# Patient Record
Sex: Female | Born: 1975 | Race: Black or African American | Hispanic: No | Marital: Single
Health system: Southern US, Community
[De-identification: ages and names within clinical notes are randomized; demographics above are authoritative.]

## PROBLEM LIST (undated history)

## (undated) DIAGNOSIS — J449 Chronic obstructive pulmonary disease, unspecified: Secondary | ICD-10-CM

## (undated) DIAGNOSIS — I10 Essential (primary) hypertension: Secondary | ICD-10-CM

## (undated) DIAGNOSIS — J45909 Unspecified asthma, uncomplicated: Secondary | ICD-10-CM

## (undated) HISTORY — PX: KNEE SURGERY: SHX244

## (undated) HISTORY — PX: HIP SURGERY: SHX245

## (undated) HISTORY — DX: Unspecified asthma, uncomplicated: J45.909

## (undated) HISTORY — DX: Essential (primary) hypertension: I10

---

## 2008-04-26 ENCOUNTER — Emergency Department (HOSPITAL_COMMUNITY): Admission: EM | Admit: 2008-04-26 | Discharge: 2008-04-26 | Payer: Self-pay | Admitting: Emergency Medicine

## 2008-06-13 ENCOUNTER — Ambulatory Visit: Payer: Self-pay | Admitting: Diagnostic Radiology

## 2008-06-13 ENCOUNTER — Ambulatory Visit (HOSPITAL_BASED_OUTPATIENT_CLINIC_OR_DEPARTMENT_OTHER): Admission: RE | Admit: 2008-06-13 | Discharge: 2008-06-13 | Payer: Self-pay | Admitting: Family Medicine

## 2008-06-19 ENCOUNTER — Encounter: Admission: RE | Admit: 2008-06-19 | Discharge: 2008-06-19 | Payer: Self-pay | Admitting: Family Medicine

## 2010-05-22 LAB — URINALYSIS, ROUTINE W REFLEX MICROSCOPIC
Nitrite: NEGATIVE
Specific Gravity, Urine: 1.021 (ref 1.005–1.030)
Urobilinogen, UA: 1 mg/dL (ref 0.0–1.0)
pH: 8.5 — ABNORMAL HIGH (ref 5.0–8.0)

## 2010-05-22 LAB — CBC
Platelets: 302 10*3/uL (ref 150–400)
RBC: 4.07 MIL/uL (ref 3.87–5.11)
WBC: 4.5 10*3/uL (ref 4.0–10.5)

## 2010-05-22 LAB — LIPASE, BLOOD: Lipase: 25 U/L (ref 11–59)

## 2010-05-22 LAB — COMPREHENSIVE METABOLIC PANEL
ALT: 16 U/L (ref 0–35)
AST: 22 U/L (ref 0–37)
Albumin: 3.6 g/dL (ref 3.5–5.2)
CO2: 24 mEq/L (ref 19–32)
Chloride: 107 mEq/L (ref 96–112)
Creatinine, Ser: 0.84 mg/dL (ref 0.4–1.2)
GFR calc Af Amer: >60 mL/min (ref >60)
GFR calc non Af Amer: >60 mL/min (ref >60)
Sodium: 138 mEq/L (ref 135–145)
Total Bilirubin: 1 mg/dL (ref 0.3–1.2)

## 2010-05-22 LAB — POCT PREGNANCY, URINE: Preg Test, Ur: NEGATIVE

## 2010-05-22 LAB — DIFFERENTIAL
Eosinophils Absolute: 0.1 10*3/uL (ref 0.0–0.7)
Eosinophils Relative: 3 % (ref 0–5)
Lymphocytes Relative: 31 % (ref 12–46)
Lymphs Abs: 1.4 10*3/uL (ref 0.7–4.0)
Monocytes Absolute: 0.3 10*3/uL (ref 0.1–1.0)

## 2010-07-24 ENCOUNTER — Emergency Department (HOSPITAL_COMMUNITY)
Admission: EM | Admit: 2010-07-24 | Discharge: 2010-07-24 | Disposition: A | Discharge status: Home / Self Care | Payer: Medicaid Other | Attending: Emergency Medicine | Admitting: Emergency Medicine

## 2010-07-24 ENCOUNTER — Emergency Department (HOSPITAL_COMMUNITY): Payer: Medicaid Other

## 2010-07-24 DIAGNOSIS — F17200 Nicotine dependence, unspecified, uncomplicated: Secondary | ICD-10-CM | POA: Insufficient documentation

## 2010-07-24 DIAGNOSIS — J40 Bronchitis, not specified as acute or chronic: Secondary | ICD-10-CM | POA: Insufficient documentation

## 2010-07-24 DIAGNOSIS — N63 Unspecified lump in unspecified breast: Secondary | ICD-10-CM | POA: Insufficient documentation

## 2010-07-24 DIAGNOSIS — R093 Abnormal sputum: Secondary | ICD-10-CM | POA: Insufficient documentation

## 2010-07-24 DIAGNOSIS — R05 Cough: Secondary | ICD-10-CM | POA: Insufficient documentation

## 2010-07-24 DIAGNOSIS — R079 Chest pain, unspecified: Secondary | ICD-10-CM | POA: Insufficient documentation

## 2010-07-24 DIAGNOSIS — R059 Cough, unspecified: Secondary | ICD-10-CM | POA: Insufficient documentation

## 2011-01-15 ENCOUNTER — Other Ambulatory Visit: Payer: Self-pay | Admitting: Internal Medicine

## 2011-01-15 DIAGNOSIS — Z1231 Encounter for screening mammogram for malignant neoplasm of breast: Secondary | ICD-10-CM

## 2011-02-11 ENCOUNTER — Ambulatory Visit
Admission: RE | Admit: 2011-02-11 | Discharge: 2011-02-11 | Disposition: A | Discharge status: Home / Self Care | Payer: Medicaid Other | Source: Ambulatory Visit | Attending: Unknown Physician Specialty | Admitting: Unknown Physician Specialty

## 2011-02-11 DIAGNOSIS — Z1231 Encounter for screening mammogram for malignant neoplasm of breast: Secondary | ICD-10-CM

## 2012-02-23 ENCOUNTER — Other Ambulatory Visit: Payer: Self-pay | Admitting: Unknown Physician Specialty

## 2012-02-23 DIAGNOSIS — Z1231 Encounter for screening mammogram for malignant neoplasm of breast: Secondary | ICD-10-CM

## 2012-03-23 ENCOUNTER — Ambulatory Visit: Payer: Medicaid Other

## 2012-04-11 ENCOUNTER — Ambulatory Visit: Payer: Medicaid Other

## 2014-08-30 DIAGNOSIS — M179 Osteoarthritis of knee, unspecified: Secondary | ICD-10-CM | POA: Insufficient documentation

## 2014-08-30 DIAGNOSIS — M1710 Unilateral primary osteoarthritis, unspecified knee: Secondary | ICD-10-CM | POA: Insufficient documentation

## 2015-05-27 ENCOUNTER — Encounter: Payer: Self-pay | Admitting: Family Medicine

## 2015-05-27 ENCOUNTER — Ambulatory Visit (INDEPENDENT_AMBULATORY_CARE_PROVIDER_SITE_OTHER): Payer: Medicaid Other | Admitting: Family Medicine

## 2015-05-27 VITALS — BP 136/78 | HR 71 | Temp 98.6°F | Ht 66.0 in | Wt 253.0 lb

## 2015-05-27 DIAGNOSIS — Z7189 Other specified counseling: Secondary | ICD-10-CM | POA: Diagnosis not present

## 2015-05-27 DIAGNOSIS — F3181 Bipolar II disorder: Secondary | ICD-10-CM

## 2015-05-27 DIAGNOSIS — M1711 Unilateral primary osteoarthritis, right knee: Secondary | ICD-10-CM

## 2015-05-27 DIAGNOSIS — Z7689 Persons encountering health services in other specified circumstances: Secondary | ICD-10-CM

## 2015-05-27 DIAGNOSIS — J45909 Unspecified asthma, uncomplicated: Secondary | ICD-10-CM

## 2015-05-27 DIAGNOSIS — Z862 Personal history of diseases of the blood and blood-forming organs and certain disorders involving the immune mechanism: Secondary | ICD-10-CM | POA: Insufficient documentation

## 2015-05-27 DIAGNOSIS — Z720 Tobacco use: Secondary | ICD-10-CM | POA: Insufficient documentation

## 2015-05-27 DIAGNOSIS — M129 Arthropathy, unspecified: Secondary | ICD-10-CM | POA: Diagnosis not present

## 2015-05-27 DIAGNOSIS — Z9851 Tubal ligation status: Secondary | ICD-10-CM

## 2015-05-27 DIAGNOSIS — Z8711 Personal history of peptic ulcer disease: Secondary | ICD-10-CM

## 2015-05-27 DIAGNOSIS — F1011 Alcohol abuse, in remission: Secondary | ICD-10-CM

## 2015-05-27 DIAGNOSIS — F1010 Alcohol abuse, uncomplicated: Secondary | ICD-10-CM

## 2015-05-27 DIAGNOSIS — Z789 Other specified health status: Secondary | ICD-10-CM

## 2015-05-27 DIAGNOSIS — Z8719 Personal history of other diseases of the digestive system: Secondary | ICD-10-CM | POA: Diagnosis not present

## 2015-05-27 HISTORY — DX: Personal history of peptic ulcer disease: Z87.11

## 2015-05-27 HISTORY — DX: Personal history of diseases of the blood and blood-forming organs and certain disorders involving the immune mechanism: Z86.2

## 2015-05-27 HISTORY — DX: Unilateral primary osteoarthritis, right knee: M17.11

## 2015-05-27 HISTORY — DX: Bipolar II disorder: F31.81

## 2015-05-27 HISTORY — DX: Unspecified asthma, uncomplicated: J45.909

## 2015-05-27 HISTORY — DX: Tobacco use: Z72.0

## 2015-05-27 HISTORY — DX: Other specified health status: Z78.9

## 2015-05-27 HISTORY — DX: Tubal ligation status: Z98.51

## 2015-05-27 HISTORY — DX: Alcohol abuse, in remission: F10.11

## 2015-05-27 LAB — COMPLETE METABOLIC PANEL WITH GFR
ALBUMIN: 4 g/dL (ref 3.6–5.1)
ALK PHOS: 76 U/L (ref 33–115)
ALT: 11 U/L (ref 6–29)
AST: 11 U/L (ref 10–30)
BUN: 11 mg/dL (ref 7–25)
CALCIUM: 9.6 mg/dL (ref 8.6–10.2)
CHLORIDE: 106 mmol/L (ref 98–110)
CO2: 26 mmol/L (ref 20–31)
Creat: 0.86 mg/dL (ref 0.50–1.10)
GFR, Est Non African American: 84 mL/min (ref >=60)
Glucose, Bld: 81 mg/dL (ref 65–99)
POTASSIUM: 4.2 mmol/L (ref 3.5–5.3)
Sodium: 142 mmol/L (ref 135–146)
Total Bilirubin: 0.5 mg/dL (ref 0.2–1.2)
Total Protein: 6.7 g/dL (ref 6.1–8.1)

## 2015-05-27 LAB — LIPID PANEL
CHOLESTEROL: 182 mg/dL (ref 125–200)
HDL: 36 mg/dL — AB (ref >=46)
LDL CALC: 90 mg/dL (ref <130)
TRIGLYCERIDES: 280 mg/dL — AB (ref <150)
Total CHOL/HDL Ratio: 5.1 Ratio — ABNORMAL HIGH (ref <=5.0)
VLDL: 56 mg/dL — AB (ref <30)

## 2015-05-27 LAB — CBC
HEMATOCRIT: 43.5 % (ref 35.0–45.0)
HEMOGLOBIN: 14.5 g/dL (ref 11.7–15.5)
MCH: 31.9 pg (ref 27.0–33.0)
MCHC: 33.3 g/dL (ref 32.0–36.0)
MCV: 95.8 fL (ref 80.0–100.0)
MPV: 9.1 fL (ref 7.5–12.5)
Platelets: 342 10*3/uL (ref 140–400)
RBC: 4.54 MIL/uL (ref 3.80–5.10)
RDW: 13.2 % (ref 11.0–15.0)
WBC: 6.6 10*3/uL (ref 3.8–10.8)

## 2015-05-27 LAB — TSH: TSH: 1.98 mIU/L

## 2015-05-27 MED ORDER — NALTREXONE HCL 50 MG PO TABS: 50.0000 mg | ORAL_TABLET | Freq: Every day | ORAL | Status: DC

## 2015-05-27 MED ORDER — LINACLOTIDE 290 MCG PO CAPS: 290.0000 ug | ORAL_CAPSULE | Freq: Every day | ORAL | Status: DC

## 2015-05-27 MED ORDER — CLONIDINE HCL 0.2 MG PO TABS: 0.2000 mg | ORAL_TABLET | Freq: Two times a day (BID) | ORAL | Status: DC

## 2015-05-27 MED ORDER — MELOXICAM 15 MG PO TABS: 15.0000 mg | ORAL_TABLET | Freq: Every day | ORAL | Status: DC | PRN

## 2015-05-27 MED ORDER — ALBUTEROL SULFATE HFA 108 (90 BASE) MCG/ACT IN AERS: 2.0000 | INHALATION_SPRAY | Freq: Four times a day (QID) | RESPIRATORY_TRACT | Status: AC | PRN

## 2015-05-27 MED ORDER — HYDROXYZINE PAMOATE 50 MG PO CAPS: 50.0000 mg | ORAL_CAPSULE | Freq: Three times a day (TID) | ORAL | Status: DC | PRN

## 2015-05-27 NOTE — Patient Instructions (Signed)
It was a pleasure seeing you today in our clinic. Today we discussed Establishing care in our clinic and your knee pain. Here is the treatment plan we have discussed and agreed upon together:   - I would like for you to begin icing her knee every night when most convenient. When icing apply the ice to the skin for 15-20 minutes then remove the ice for a period of 30 minutes before reapplying for an additional 15-20 minutes. - The meloxicam I prescribed you is the same dose that you had been previously prescribed by the outside provider. Take this medication sparingly as it can cause some stomach discomfort over time. - Take Tylenol as needed for any breakthrough discomfort involving your knee. You may take up to 3000 mg of this medication every day. - In the future be happy to provide any steroid knee injections  If this is something you would be interested in.

## 2015-05-27 NOTE — Assessment & Plan Note (Signed)
Stable: Patient denies any issues with this condition at this time. She states that her current medication regimen is keeping this quite stable. We discussed the risks of asthma with tobacco use and patient stated her understanding. - Readdress possibility of smoking cessation in the future.

## 2015-05-27 NOTE — Progress Notes (Signed)
HPI  CC: Establishing care with new provider Patient is here to establish care with a new provider in a new clinic. She states that she desired this change because she wanted a "good doctor". Initiallypatient states that she has regular back and knee pain but no other health concerns.after a long discussion patient states that she also has asthma which has persisted since her childhood and bipolar 2 disorder. A significant amount of time was spent discussing her past medical history as well as going through her medication list.  Patient also has a history significant for alcohol abuse as she is currently being treated with naltrexone.  She states that this was started approximately 6-9 months ago.At this time she states that shedrinks alcohol once to twice a week.  Patient is a daily smoker and smokes approximately 0.75 packs daily. She used to smoke  As much as 1.5 packs daily. She started smoking when she was 40 years old.  Patient's knee pain is described as "arthritic".She states that she occasionally experiences some clicking/catching. She denies any significant swelling however she does report warmth. Patient has received intra-articular steroid injections in the past but has not had one for approximately 6 months.She currently takes meloxicam for this pain.she denies any injury to this area but states that she had played volleyball and ran track as a child.  ROS: patient denies any headache, vision changes, hearing changes, dizziness, vertigo, dysphasia,  Diaphoresis, shortness of breath, chest pain, abdominal pain,nausea, vomiting, diarrhea, constipation, dysuria, fevers, weakness, numbness, or paresthesias.  Objective: BP 136/78 mmHg  Pulse 71  Temp(Src) 98.6 F (37 C) (Oral)  Ht 5\' 6"  (1.676 m)  Wt 253 lb (114.76 kg)  BMI 40.85 kg/m2  LMP 05/27/2015 Gen: NAD, obese, alert, cooperative, and pleasant. HEENT: NCAT, EOMI, PERRL CV: RRR, no murmur Resp: CTAB, no wheezes,  non-labored Abd: SNTND, BS present, no guarding or organomegaly Ext: No edema, warm, No knee effusions present, right knee slightly warmer than left.  All 4 ligaments intact bilaterally. Bilateral patellar crepitus +1. Neuro: Alert and oriented, Speech clear, No gross deficits  Assessment and plan:  Bipolar 2 disorder (HCC) Stable: Patient states that she is not having any issues with this at this time.  The medication regimen that she is currently on has been working very well for her. - No medication changes at this time.  Arthritis of knee, right Chronic: Worse Patient reports worsening right-sided knee pain. She endorses some clicking and catching. Denies any weakness.Has had intra-articular steroid injections in the past but has not had one in over 6 months. Patient states that worsening knee pain is the primary limiting factor for her lack of daily exercise. - continue meloxicam as previously prescribed by outside provider. >> would like to discontinue her use of this medication as soon as possible (yet unwilling to start opioids in a patient with her history of substance abuse) - Discussed use of concomitant Tylenol with the meloxicam for additional discomfort control - discussed regular use of icing the affected knee to help reduce swelling and discomfort. - Consider intra-articular steroid injection in the future. - Will monitor  History of stomach ulcers No abdominal pain during the visit today. - We'll monitor closely due to her current use of Meloxicam.  Asthma, chronic Stable: Patient denies any issues with this condition at this time. She states that her current medication regimen is keeping this quite stable. We discussed the risks of asthma with tobacco use and patient stated her understanding. -  Readdress possibility of smoking cessation in the future.    Orders Placed This Encounter  Procedures  . COMPLETE METABOLIC PANEL WITH GFR  . CBC  . Lipid panel  . TSH     Meds ordered this encounter  Medications  . Lurasidone HCl (LATUDA) 60 MG TABS    Sig: Take 60 mg by mouth at bedtime.  Marland Kitchen DISCONTD: cloNIDine (CATAPRES) 0.2 MG tablet    Sig: Take 0.2 mg by mouth 2 (two) times daily.  . hydrochlorothiazide (HYDRODIURIL) 50 MG tablet    Sig: Take 50 mg by mouth daily.  Marland Kitchen DISCONTD: hydrOXYzine (VISTARIL) 50 MG capsule    Sig: Take 50 mg by mouth 3 (three) times daily as needed. As needed for anxiety.  Marland Kitchen omeprazole (PRILOSEC) 20 MG capsule    Sig: Take 20 mg by mouth daily.  Marland Kitchen DISCONTD: meloxicam (MOBIC) 15 MG tablet    Sig: Take 15 mg by mouth daily as needed.    Refill:  0  . cloNIDine (CATAPRES) 0.2 MG tablet    Sig: Take 1 tablet (0.2 mg total) by mouth 2 (two) times daily.    Dispense:  60 tablet    Refill:  3  . meloxicam (MOBIC) 15 MG tablet    Sig: Take 1 tablet (15 mg total) by mouth daily as needed.    Dispense:  30 tablet    Refill:  1  . hydrOXYzine (VISTARIL) 50 MG capsule    Sig: Take 1 capsule (50 mg total) by mouth 3 (three) times daily as needed. As needed for anxiety.    Dispense:  60 capsule    Refill:  2  . linaclotide (LINZESS) 290 MCG CAPS capsule    Sig: Take 1 capsule (290 mcg total) by mouth daily before breakfast.    Dispense:  30 capsule    Refill:  5  . naltrexone (DEPADE) 50 MG tablet    Sig: Take 1 tablet (50 mg total) by mouth daily.    Dispense:  30 tablet    Refill:  5  . albuterol (PROVENTIL HFA;VENTOLIN HFA) 108 (90 Base) MCG/ACT inhaler    Sig: Inhale 2 puffs into the lungs every 6 (six) hours as needed for wheezing or shortness of breath.    Dispense:  1 Inhaler    Refill:  2     Kathee Delton, MD,MS,  PGY2 05/27/2015 5:55 PM

## 2015-05-27 NOTE — Assessment & Plan Note (Signed)
No abdominal pain during the visit today. - We'll monitor closely due to her current use of Meloxicam.

## 2015-05-27 NOTE — Assessment & Plan Note (Signed)
Stable: Patient states that she is not having any issues with this at this time.  The medication regimen that she is currently on has been working very well for her. - No medication changes at this time.

## 2015-05-27 NOTE — Assessment & Plan Note (Addendum)
Chronic: Worse Patient reports worsening right-sided knee pain. She endorses some clicking and catching. Denies any weakness.Has had intra-articular steroid injections in the past but has not had one in over 6 months. Patient states that worsening knee pain is the primary limiting factor for her lack of daily exercise. - continue meloxicam as previously prescribed by outside provider. >> would like to discontinue her use of this medication as soon as possible (yet unwilling to start opioids in a patient with her history of substance abuse) - Discussed use of concomitant Tylenol with the meloxicam for additional discomfort control - discussed regular use of icing the affected knee to help reduce swelling and discomfort. - Consider intra-articular steroid injection in the future. - Will monitor

## 2015-05-30 ENCOUNTER — Encounter: Payer: Self-pay | Admitting: Family Medicine

## 2015-05-30 ENCOUNTER — Other Ambulatory Visit: Payer: Self-pay | Admitting: Family Medicine

## 2015-05-30 MED ORDER — ATORVASTATIN CALCIUM 40 MG PO TABS: 40.0000 mg | ORAL_TABLET | Freq: Every day | ORAL | Status: DC

## 2015-06-18 ENCOUNTER — Ambulatory Visit (INDEPENDENT_AMBULATORY_CARE_PROVIDER_SITE_OTHER): Payer: Medicaid Other | Admitting: Family Medicine

## 2015-06-18 ENCOUNTER — Encounter: Payer: Self-pay | Admitting: Family Medicine

## 2015-06-18 VITALS — BP 126/81 | HR 75 | Temp 98.4°F | Ht 66.0 in | Wt 260.0 lb

## 2015-06-18 DIAGNOSIS — M25552 Pain in left hip: Secondary | ICD-10-CM

## 2015-06-18 NOTE — Patient Instructions (Signed)
It was a pleasure seeing you today in our clinic. Today we discussed your hip pain. Here is the treatment plan we have discussed and agreed upon together:   - At this time I'm concerned that he may have some osteoarthritis of your left hip. - Today I've ordered x-rays of your hip and pelvis. [facility] [address] to have these performed. - Once these x-rays are done you may want to schedule a follow-up appointment with me to go over these results. - For now I would recommend regularly icing this area and taking Tylenol for the pain as directed on the bottle.

## 2015-06-18 NOTE — Progress Notes (Signed)
   HPI  CC: Left hip pain Been going on for the last month. Worse in past 2-3 days. Feels like a toothache. Worse w/ walking. "less bad" w/ laying down. Pain radiates to knee (all along lateral side). Upstairs is also much worse. No weakness. Groin pain is also associated with these symptoms. Feels like she has a knot near groin. Denies any injury. It does not swell or become stiff. Pain is worse first thing in the morning and late at night. She denies any limp. No weakness or numbness. Does not know of any medicine that has made this feel better.  Review of Systems   See HPI for ROS. All other systems reviewed and are negative.  CC, SH/smoking status, and VS noted  Objective: BP 126/81 mmHg  Pulse 75  Temp(Src) 98.4 F (36.9 C) (Oral)  Ht 5\' 6"  (1.676 m)  Wt 260 lb (117.935 kg)  BMI 41.99 kg/m2  SpO2 99%  LMP 05/27/2015 Gen: NAD, alert, cooperative, and pleasant. Hip, left: No evidence of swelling, erythema, or injury. Pain to palpation over the greater trochanter, left SI joint, and left groin (with groin being the most significant and most similar to the pain she experiences daily), Significantly decreased range of motion secondary to pain (active and passive), positive Pearlean BrownieFaber, strength 4/5 (unsure if effort was at 100%), sensation intact throughout. No evidence of pain/swelling/instability of the left knee. Neuro: Alert and oriented, Speech clear, No gross deficits  Assessment and plan:  Hip pain Patient is complaining of left-sided hip pain. She denies any gait abnormality or substantial weakness. On physical exam patient stated that the pain experienced with direct palpation over the groin was most consistent with the pain she is complaining of today. 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 Will obtain hip x-rays at this time. - Encouraged Tylenol/Aleve as directed on the  bottle. - Encouraged regular icing of the affected area. - We'll follow-up hip radiographs. - Patient may need referral for intra-articular steroid injections into the affected hip. If patient fails this therapy she will likely qualify for total hip arthroplasty (but I do not suspect she will need this aggressive treatment for quite some time.)    Orders Placed This Encounter  Procedures  . DG HIP UNILAT WITH PELVIS 2-3 VIEWS LEFT    Please ensure these are WEIGHT BEARING radiographs.    Standing Status: Future     Number of Occurrences:      Standing Expiration Date: 08/17/2016    Order Specific Question:  Reason for Exam (SYMPTOM  OR DIAGNOSIS REQUIRED)    Answer:  groin/hip pain    Order Specific Question:  Is the patient pregnant?    Answer:  No    Order Specific Question:  Preferred imaging location?    Answer:  [facility]    Kathee DeltonIan D Gwendy Boeder, MD,MS,  PGY2 06/19/2015 12:21 AM

## 2015-06-19 DIAGNOSIS — M25559 Pain in unspecified hip: Secondary | ICD-10-CM | POA: Insufficient documentation

## 2015-06-19 HISTORY — DX: Pain in unspecified hip: M25.559

## 2015-06-19 NOTE — Assessment & Plan Note (Signed)
Patient is complaining of left-sided hip pain. She denies any gait abnormality or substantial weakness. On physical exam patient stated that the pain experienced with direct palpation over the groin was most consistent with the pain she is complaining of today. 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 Will obtain hip x-rays at this time. - Encouraged Tylenol/Aleve as directed on the bottle. - Encouraged regular icing of the affected area. - We'll follow-up hip radiographs. - Patient may need referral for intra-articular steroid injections into the affected hip. If patient fails this therapy she will likely qualify for total hip arthroplasty (but I do not suspect she will need this aggressive treatment for quite some time.)

## 2015-06-20 ENCOUNTER — Ambulatory Visit
Admission: RE | Admit: 2015-06-20 | Discharge: 2015-06-20 | Disposition: A | Discharge status: Home / Self Care | Payer: Medicaid Other | Source: Ambulatory Visit | Attending: Family Medicine | Admitting: Family Medicine

## 2015-06-20 DIAGNOSIS — M25552 Pain in left hip: Secondary | ICD-10-CM

## 2015-07-04 ENCOUNTER — Ambulatory Visit: Payer: Medicaid Other | Admitting: Family Medicine

## 2015-07-19 ENCOUNTER — Encounter: Payer: Self-pay | Admitting: Family Medicine

## 2015-07-19 ENCOUNTER — Ambulatory Visit (INDEPENDENT_AMBULATORY_CARE_PROVIDER_SITE_OTHER): Payer: Medicaid Other | Admitting: Family Medicine

## 2015-07-19 VITALS — BP 139/66 | HR 85 | Temp 98.0°F | Ht 66.0 in | Wt 264.2 lb

## 2015-07-19 DIAGNOSIS — M1612 Unilateral primary osteoarthritis, left hip: Secondary | ICD-10-CM

## 2015-07-19 DIAGNOSIS — H40003 Preglaucoma, unspecified, bilateral: Secondary | ICD-10-CM

## 2015-07-19 DIAGNOSIS — H43813 Vitreous degeneration, bilateral: Secondary | ICD-10-CM

## 2015-07-19 DIAGNOSIS — M1990 Unspecified osteoarthritis, unspecified site: Secondary | ICD-10-CM

## 2015-07-19 HISTORY — DX: Preglaucoma, unspecified, bilateral: H40.003

## 2015-07-19 HISTORY — DX: Vitreous degeneration, bilateral: H43.813

## 2015-07-19 HISTORY — DX: Unilateral primary osteoarthritis, left hip: M16.12

## 2015-07-19 MED ORDER — PREDNISONE 50 MG PO TABS: 50.0000 mg | ORAL_TABLET | Freq: Every day | ORAL | Status: DC

## 2015-07-19 NOTE — Patient Instructions (Signed)

## 2015-07-19 NOTE — Progress Notes (Signed)
   HPI  CC: Left hip pain Patient is here with continued complaints of left hip pain. At our last visit I had ordered radiographs to be obtained. These showed bone spurring and joint space narrowing of the left acetabulum. Patient states that her pain persists. She denies the use of any ice or heat but states that she has been using Tylenol and Aleve for her pain. These medications have provided some relief but the pain is still very much present. She is looking for additional help at this time.  ROS: Patient denies any trauma/injury, fever, chills, erythema, ecchymoses, swelling, new reduced range of motion, weakness, numbness, or paresthesias.  Objective: BP 139/66 mmHg  Pulse 85  Temp(Src) 98 F (36.7 C) (Oral)  Ht 5\' 6"  (1.676 m)  Wt 264 lb 3.2 oz (119.84 kg)  BMI 42.66 kg/m2  LMP 06/26/2015 Gen: NAD, alert, cooperative, and pleasant. Hip, left: No evidence of swelling, erythema, or injury. Pain to palpation over the greater trochanter, left SI joint, and left groin (with groin being the most significant and most similar to the pain she experiences daily), Significantly decreased range of motion secondary to pain (active and passive), positive Faber, strength 5/5, sensation intact throughout.  Neuro: Alert and oriented, Speech clear, No gross deficits  Assessment and plan:  Arthritis of left hip Patient continues to have pain in her left hip. Pain is unchanged from previous. She states that she has some relief with NSAIDs. Noncompliant with application of ice or home exercises. - Referral to sports medicine; with hope to assess under ultrasound with possibility of intra-articular hip injection. - Referral to physical therapy; with hope for increased compliance with formal physical therapy since home exercises did not prove to be motivating. - Short burst of oral steroids >> advised to discontinue the use of ibuprofen/Aleve/Mobic while on oral prednisone    Orders Placed This  Encounter  Procedures  . Ambulatory referral to Sports Medicine    Referral Priority:  Routine    Referral Type:  Consultation    Referral Reason:  Specialty Services Required    Requested Specialty:  Sports Medicine    Number of Visits Requested:  1  . Ambulatory referral to Physical Therapy    Referral Priority:  Routine    Referral Type:  Physical Medicine    Referral Reason:  Specialty Services Required    Requested Specialty:  Physical Therapy    Number of Visits Requested:  1    Meds ordered this encounter  Medications  . predniSONE (DELTASONE) 50 MG tablet    Sig: Take 1 tablet (50 mg total) by mouth daily with breakfast.    Dispense:  5 tablet    Refill:  0     Kathee DeltonIan D Gerson Fauth, MD,MS,  PGY2 07/19/2015 6:48 PM

## 2015-07-19 NOTE — Assessment & Plan Note (Addendum)
Patient continues to have pain in her left hip. Pain is unchanged from previous. She states that she has some relief with NSAIDs. Noncompliant with application of ice or home exercises. - Referral to sports medicine; with hope to assess under ultrasound with possibility of intra-articular hip injection. - Referral to physical therapy; with hope for increased compliance with formal physical therapy since home exercises did not prove to be motivating. - Short burst of oral steroids >> advised to discontinue the use of ibuprofen/Aleve/Mobic while on oral prednisone

## 2015-07-26 ENCOUNTER — Encounter: Payer: Self-pay | Admitting: Family Medicine

## 2015-07-26 ENCOUNTER — Ambulatory Visit (INDEPENDENT_AMBULATORY_CARE_PROVIDER_SITE_OTHER): Payer: Medicaid Other | Admitting: Family Medicine

## 2015-07-26 VITALS — BP 132/81 | HR 69 | Ht 66.0 in | Wt 243.0 lb

## 2015-07-26 DIAGNOSIS — M1612 Unilateral primary osteoarthritis, left hip: Secondary | ICD-10-CM

## 2015-07-26 DIAGNOSIS — M1990 Unspecified osteoarthritis, unspecified site: Secondary | ICD-10-CM | POA: Diagnosis present

## 2015-07-26 NOTE — Patient Instructions (Signed)
I'm concerned about you having a labral tear of your hip though your mild arthritis being flared up can cause this as well. We will set you up for an intraarticular cortisone injection. Ibuprofen or aleve as needed for pain. Ok to take tylenol in addition to this. Cane or crutch to help with getting around. Heat or ice (whichever feels better) 15 minutes at a time 3-4 times a day. Call me a week after the injection to let me know how you're doing. We will consider an MRI arthrogram if you're still not improving.

## 2015-07-29 ENCOUNTER — Other Ambulatory Visit: Payer: Self-pay | Admitting: Family Medicine

## 2015-07-29 DIAGNOSIS — M25552 Pain in left hip: Secondary | ICD-10-CM

## 2015-08-05 ENCOUNTER — Ambulatory Visit: Payer: Medicaid Other | Attending: Family Medicine | Admitting: Physical Therapy

## 2015-08-05 DIAGNOSIS — M25552 Pain in left hip: Secondary | ICD-10-CM | POA: Diagnosis present

## 2015-08-05 DIAGNOSIS — M25652 Stiffness of left hip, not elsewhere classified: Secondary | ICD-10-CM | POA: Insufficient documentation

## 2015-08-05 NOTE — Assessment & Plan Note (Signed)
independently reviewed radiographs showing only mild arthritis.  Concern for flare of this arthritis vs labral tear based on her exam.  Discussed options (PT, injection) - she would like to go ahead with injection first based on level of pain.  Cane or crutch to help with ambulation, nsaids and tylenol if needed.  Heat/ice.  Call us a week after injection for update on her status.  MR arthrogram another consideration in future.

## 2015-08-05 NOTE — Progress Notes (Signed)
PCP and consultation requested by: Mickie HillierIan McKeag, MD  Subjective:   HPI: Patient is a 40 y.o. female here for left hip pain.  Patient reports she started to get pain in left hip anteriorly about 2 months ago. She did have a fall 4 months ago but unsure if current pain related to that. Pain goes down leg laterally but originates anterolateral left hip. Pain level 7/10, sharp.  Worse with walking. Worse lying on left side. Tried ibuprofen, tylenol, aleve as well. Limping with walking. No skin changes. Gets numbness in left foot if stands too long only.  No past medical history on file.  Current Outpatient Prescriptions on File Prior to Visit  Medication Sig Dispense Refill  . albuterol (PROVENTIL HFA;VENTOLIN HFA) 108 (90 Base) MCG/ACT inhaler Inhale 2 puffs into the lungs every 6 (six) hours as needed for wheezing or shortness of breath. 1 Inhaler 2  . atorvastatin (LIPITOR) 40 MG tablet Take 1 tablet (40 mg total) by mouth daily. 90 tablet 3  . cloNIDine (CATAPRES) 0.2 MG tablet Take 1 tablet (0.2 mg total) by mouth 2 (two) times daily. 60 tablet 3  . hydrochlorothiazide (HYDRODIURIL) 50 MG tablet Take 50 mg by mouth daily.    . hydrOXYzine (VISTARIL) 50 MG capsule Take 1 capsule (50 mg total) by mouth 3 (three) times daily as needed. As needed for anxiety. 60 capsule 2  . linaclotide (LINZESS) 290 MCG CAPS capsule Take 1 capsule (290 mcg total) by mouth daily before breakfast. 30 capsule 5  . Lurasidone HCl (LATUDA) 60 MG TABS Take 60 mg by mouth at bedtime.    . meloxicam (MOBIC) 15 MG tablet Take 1 tablet (15 mg total) by mouth daily as needed. 30 tablet 1  . naltrexone (DEPADE) 50 MG tablet Take 1 tablet (50 mg total) by mouth daily. 30 tablet 5  . omeprazole (PRILOSEC) 20 MG capsule Take 20 mg by mouth daily.    . predniSONE (DELTASONE) 50 MG tablet Take 1 tablet (50 mg total) by mouth daily with breakfast. 5 tablet 0   No current facility-administered medications on file prior to  visit.    No past surgical history on file.  Allergies  Allergen Reactions  . Penicillins Hives  . Aspirin     Social History   Social History  . Marital Status: Single    Spouse Name: N/A  . Number of Children: N/A  . Years of Education: N/A   Occupational History  . Not on file.   Social History Main Topics  . Smoking status: Current Every Day Smoker -- 0.75 packs/day for 21 years    Types: Cigarettes  . Smokeless tobacco: Not on file  . Alcohol Use: 1.2 oz/week    2 Cans of beer per week  . Drug Use: Not on file  . Sexual Activity: Yes    Birth Control/ Protection: Surgical   Other Topics Concern  . Not on file   Social History Narrative    No family history on file.  BP 132/81 mmHg  Pulse 69  Ht 5\' 6"  (1.676 m)  Wt 243 lb (110.224 kg)  BMI 39.24 kg/m2  LMP 06/26/2015  Review of Systems: See HPI above.    Objective:  Physical Exam:  Gen: NAD, comfortable in exam room  Back/Left hip: No gross deformity, scoliosis. No TTP hip, low back.  No midline or bony TTP. FROM back.  Mild limitation IR and ER of hip with pain. Strength LEs 5/5 all muscle groups.  2+ MSRs in patellar and achilles tendons, equal bilaterally. Negative SLRs. Sensation intact to light touch bilaterally. Mild positive left hip logroll.  Negative right hip logroll Negative fabers and piriformis stretches.    Assessment & Plan:  1. Left hip pain - independently reviewed radiographs showing only mild arthritis.  Concern for flare of this arthritis vs labral tear based on her exam.  Discussed options (PT, injection) - she would like to go ahead with injection first based on level of pain.  Cane or crutch to help with ambulation, nsaids and tylenol if needed.  Heat/ice.  Call us a week after injection for update on her status.  MR arthrogram another consideration in future.

## 2015-08-05 NOTE — Therapy (Signed)
[facility] [place] [address]  [address] [place], [place], [address] Phone: [phone]   Fax:  [phone]  Physical Therapy Evaluation  Patient Details  Name: Sandy Blankenship MRN: [id number] Date of Birth: [birth year] Referring Provider: Pearlean BrownieMarshall Chambliss, MD  Encounter Date: 08/05/2015      PT End of Session - 08/05/15 1247    Visit Number 1   PT Start Time 0930   PT Stop Time 1004   PT Time Calculation (min) 34 min   Activity Tolerance Patient tolerated treatment well   Behavior During Therapy [facility] for tasks assessed/performed      No past medical history on file.  No past surgical history on file.  There were no vitals filed for this visit.       Subjective Assessment - 08/05/15 0933    Subjective Pt is a 40 y/o female who presents to OPPT for L hip OA.  Pt reports onsets of L hip pain a few months ago.  Pt reports no interventions so far but injection scheduled for Thursday.   Limitations Walking;Standing;Sitting   How long can you sit comfortably? 1 hour   How long can you stand comfortably? 10-15 min   How long can you walk comfortably? 20 min   Diagnostic tests MRI: arthritis of L hip; bone spurs   Patient Stated Goals return to walking   Currently in Pain? Yes   Pain Score 8    Pain Location Hip   Pain Orientation Left;Lateral   Pain Descriptors / Indicators Sharp;Aching   Pain Type --  subacute   Pain Onset 1 to 4 weeks ago   Pain Frequency Constant   Aggravating Factors  standing; walking, bending; trying to raise leg   Pain Relieving Factors heat            [facility] PT Assessment - 08/05/15 0937    Assessment   Medical Diagnosis L hip OA   Referring Provider Pearlean BrownieMarshall Chambliss, MD   Onset Date/Surgical Date 05/13/15  approx   Prior Therapy 3 years ago for back   Precautions   Precautions None   Restrictions   Weight Bearing Restrictions No   Balance Screen   Has the patient fallen in  the past 6 months Yes   How many times? 1 - fell down hill wearing heels and carrying multiple itens   Has the patient had a decrease in activity level because of a fear of falling?  Yes   Is the patient reluctant to leave their home because of a fear of falling?  No   Home Environment   Living Environment Private residence   Living Arrangements Children   Type of Home Apartment   Home Access Stairs to enter   Entrance Stairs-Number of Steps 14   Entrance Stairs-Rails Can reach both;Left;Right   Home Layout One level   Prior Function   Level of Independence Independent   Vocation Unemployed   Leisure reading; walking   Cognition   Overall Cognitive Status Within Functional Limits for tasks assessed   AROM   Overall AROM Comments L hip limited all motions but unable to fully assess due to guarding 2/2 pain   Strength   Overall Strength Comments unable to further assess due to inability to get into testing position; suspect weakness in all hip motions   Strength Assessment Site Hip;Knee   Right Hip Flexion 4/5   Left Hip Flexion 3+/5   Left Hip ABduction 3/5  Right/Left Knee Right;Left   Right Knee Flexion 4/5   Right Knee Extension 4/5   Left Knee Flexion 4-/5   Left Knee Extension 4-/5   Flexibility   Soft Tissue Assessment /Muscle Length yes   Hamstrings tightness on L   ITB tightness on L   Piriformis tightness on L   Palpation   Palpation comment tenderness along anterior and lateral hip/groin   Ambulation/Gait   Ambulation Distance (Feet) 100 Feet   Gait Pattern Antalgic;Decreased stance time - left;Decreased step length - right;Step-to pattern                   [facility] Adult PT Treatment/Exercise - 08/05/15 0937    Exercises   Exercises Knee/Hip   Knee/Hip Exercises: Stretches   Passive Hamstring Stretch Left;2 reps;30 seconds   ITB Stretch Left;2 reps;30 seconds   Knee/Hip Exercises: Supine   Straight Leg Raises Left;10 reps   Knee/Hip Exercises:  Sidelying   Hip ABduction Left;10 reps                PT Education - 08/05/15 1246    Education provided Yes   Education Details HEP   Person(s) Educated Patient   Methods Explanation;Demonstration;Handout   Comprehension Verbalized understanding                    Plan - 08/05/15 1247    Clinical Impression Statement Pt is a 40 y/o female who presents to OPPT for moderate complexity evaluation of L hip pain; likely due to OA.  Pt demonstrates pain, decreased strength and ROM affecting gait and ability to return to regular exercises.  Issued HEP today and encouraged multiple daily short walks to maintain mobility; letting pain guide activity.  Pt has limited tolerance to exercises due to pain; and recommended heat and to use her TENS unit to help with pain.  Pt verbalized understanding.  Pt's insurance does not allow for additional visits and she is unable to proceed as self pay so will d/c today.   Rehab Potential Good   PT Frequency One time visit   Consulted and Agree with Plan of Care Patient      Patient will benefit from skilled therapeutic intervention in order to improve the following deficits and impairments:  Abnormal gait, Decreased strength, Difficulty walking, Pain, Decreased mobility, Decreased range of motion  Visit Diagnosis: Pain in left hip - Plan: PT plan of care cert/re-cert  Stiffness of left hip, not elsewhere classified - Plan: PT plan of care cert/re-cert     Problem List Patient Active Problem List   Diagnosis Date Noted  . Arthritis of left hip 07/19/2015  . Glaucoma suspect of both eyes 07/19/2015  . Posterior vitreous detachment of both eyes 07/19/2015  . Hip pain 06/19/2015  . No blood products 05/27/2015  . Tobacco use 05/27/2015  . History of anemia 05/27/2015  . Bipolar 2 disorder (HCC) 05/27/2015  . Arthritis of knee, right 05/27/2015  . Asthma, chronic 05/27/2015  . History of stomach ulcers 05/27/2015  . S/P tubal  ligation 05/27/2015  . History of alcohol abuse 05/27/2015   Clarita CraneStephanie F Hermelinda Diegel, PT, DPT 08/05/2015 12:54 PM  [facility] [place] [address]  [address] [place], [place], [address] Phone: [phone]   Fax:  [phone]  Name: Sandy Blankenship MRN: [id number] Date of Birth: 11/11/1975

## 2015-08-05 NOTE — Patient Instructions (Signed)
Hamstring Step 2    Left foot relaxed, knee straight, other leg bent, foot flat. Raise straight leg further upward to maximal range. Hold _20-30__ seconds. Relax leg completely down. Repeat _2-3__ times.  Copyright  VHI. All rights reserved.   Outer Hip Stretch: Reclined IT Band Stretch (Strap)    Strap around opposite foot, pull across only as far as possible with shoulders on mat. Hold for __20-30__ seconds. Repeat _2-3___ times on left leg.  Copyright  VHI. All rights reserved.   Straight Leg Raise    Bend one leg. Raise other leg _6-8___ inches with knee locked. Exhale and tighten thigh muscles while raising leg.  Repeat __5-10__ times. Do __2-3__ sessions per day.  http://[email or web address].exer.us/270   Copyright  VHI. All rights reserved.   Side Leg Raise (Side-Lying)    Lie on side with support leg bent to 90. Lift top leg, leading with heel. Keep lifted leg straight. Hold 1 count. Lower leg to starting position. Repeat __10__ times on left leg.  Perform 2-3 sessions per day.  Copyright  VHI. All rights reserved.

## 2015-08-08 ENCOUNTER — Ambulatory Visit
Admission: RE | Admit: 2015-08-08 | Discharge: 2015-08-08 | Disposition: A | Discharge status: Home / Self Care | Payer: Medicaid Other | Source: Ambulatory Visit | Attending: Family Medicine | Admitting: Family Medicine

## 2015-08-08 DIAGNOSIS — M25552 Pain in left hip: Secondary | ICD-10-CM

## 2015-08-08 MED ORDER — IOPAMIDOL (ISOVUE-M 200) INJECTION 41%
1.0000 mL | Freq: Once | INTRAMUSCULAR | Status: AC
  Administered 2015-08-08: 1 mL via INTRA_ARTICULAR

## 2015-08-08 MED ORDER — METHYLPREDNISOLONE ACETATE 40 MG/ML INJ SUSP (RADIOLOG
120.0000 mg | Freq: Once | INTRAMUSCULAR | Status: AC
  Administered 2015-08-08: 120 mg via EPIDURAL

## 2015-09-05 ENCOUNTER — Ambulatory Visit: Payer: Medicaid Other | Admitting: Family Medicine

## 2015-09-12 ENCOUNTER — Ambulatory Visit: Payer: Medicaid Other | Admitting: Family Medicine

## 2015-09-19 ENCOUNTER — Ambulatory Visit (INDEPENDENT_AMBULATORY_CARE_PROVIDER_SITE_OTHER): Payer: Medicaid Other | Admitting: Family Medicine

## 2015-09-19 ENCOUNTER — Encounter: Payer: Self-pay | Admitting: Family Medicine

## 2015-09-19 VITALS — BP 139/86 | HR 70 | Temp 98.1°F | Ht 66.0 in | Wt 270.0 lb

## 2015-09-19 DIAGNOSIS — M1990 Unspecified osteoarthritis, unspecified site: Secondary | ICD-10-CM

## 2015-09-19 DIAGNOSIS — G8929 Other chronic pain: Secondary | ICD-10-CM

## 2015-09-19 DIAGNOSIS — M1612 Unilateral primary osteoarthritis, left hip: Secondary | ICD-10-CM

## 2015-09-19 DIAGNOSIS — M25552 Pain in left hip: Secondary | ICD-10-CM

## 2015-09-19 MED ORDER — LURASIDONE HCL 60 MG PO TABS: 60.0000 mg | ORAL_TABLET | Freq: Every day | ORAL | 1 refills | Status: DC

## 2015-09-19 NOTE — Patient Instructions (Signed)
It was a pleasure seeing you today in our clinic. Today we discussed your left hip pain. Here is the treatment plan we have discussed and agreed upon together:   - I've sent a referral to orthopedic surgery. You will be contacted in the next 7-10 days to set up an appointment at their clinic. - Continue using anti-inflammatory medications to help with her pain. It sounds like you're receiving some from the pain clinic, continue their use and you may want to discuss with their providers what medications are safe to take with their prescribed medications.

## 2015-09-19 NOTE — Progress Notes (Signed)
   HPI  CC: Left hip pain Patient is here with complaints of left hip pain. She has been seen previously for this issue and had been sent for a fluoroscopic hip injection. She states that this injection provided benefit for approximately 5 days and then wore off seen after. She continues to have the same pain at this time. Pain is worse with going up and down stairs. Pain is located to the left groin and does not radiate. She has relief with offloading of this joint. X-rays were obtained and showed some mild degenerative changes with small osteophyte development. Patient denies any clicking catching or locking of this joint. She denies any injury. She denies any numbness, weakness, or paresthesias. No fevers, chills, swelling, erythema, or warmth.  Review of Systems   See HPI for ROS. All other systems reviewed and are negative.  CC, SH/smoking status, and VS noted  Objective: BP 139/86   Pulse 70   Temp 98.1 F (36.7 C) (Oral)   Ht 5\' 6"  (1.676 m)   Wt 270 lb (122.5 kg)   BMI 43.58 kg/m  Gen: NAD, alert, cooperative, and pleasant. Hip, left: No evidence of swelling, erythema, or injury. Mild discomfort over the greater trochanter and left SI joint. Significant left groin with hip flexion and ER. Significantly decreased range of motion secondary to this pain (active and passive), positive Faber, strength 5/5, sensation intact throughout.  Neuro: Alert and oriented, Speech clear, No gross deficits  Assessment and plan:  Arthritis of left hip Patient is here for chronic left hip pain. Pain is localized to the groin and is worse with forward flexion and abduction/external rotation of the hip. Etiology likely secondary to arthritic/inflammatory changes. Patient received fluoroscopic hip injection on 08/08/15 which provided approximately 5 days relief. X-rays on 06/20/15 showed mild arthritic changes to the left hip. - Patient asking for referral to orthopedic surgery. Referral sent. -  Encouraged anti-inflammatories. Patient is currently receiving these medications from pain clinic.   Orders Placed This Encounter  Procedures  . Ambulatory referral to Orthopedic Surgery    Referral Priority:   Routine    Referral Type:   Surgical    Referral Reason:   Specialty Services Required    Requested Specialty:   Orthopedic Surgery    Number of Visits Requested:   1    Meds ordered this encounter  Medications  . Lurasidone HCl (LATUDA) 60 MG TABS    Sig: Take 1 tablet (60 mg total) by mouth at bedtime.    Dispense:  30 tablet    Refill:  1     Kathee DeltonIan D McKeag, MD,MS,  PGY3 09/19/2015 2:42 PM

## 2015-09-19 NOTE — Assessment & Plan Note (Signed)
Patient is here for chronic left hip pain. Pain is localized to the groin and is worse with forward flexion and abduction/external rotation of the hip. Etiology likely secondary to arthritic/inflammatory changes. Patient received fluoroscopic hip injection on 08/08/15 which provided approximately 5 days relief. X-rays on 06/20/15 showed mild arthritic changes to the left hip. - Patient asking for referral to orthopedic surgery. Referral sent. - Encouraged anti-inflammatories. Patient is currently receiving these medications from pain clinic.

## 2016-05-07 ENCOUNTER — Telehealth: Payer: Self-pay | Admitting: *Deleted

## 2016-05-07 NOTE — Telephone Encounter (Signed)
Patient states she is having hip and back pain.  Requesting refill of Norco.  Will route request to Dr. Wende MottMcKeag.  Altamese Dilling~Jeweliana Dudgeon, BSN, RN-BC

## 2016-05-12 NOTE — Telephone Encounter (Signed)
No. Patient will need an appointment for evaluation. Even with an appointment, it is unlikely that Norco would be appropriate for atraumatic hip/back pain.

## 2016-05-13 NOTE — Telephone Encounter (Signed)
Pt informed and scheduled to see you on Tuesday. Deseree Bruna Potter, CMA

## 2016-05-18 ENCOUNTER — Telehealth: Payer: Self-pay | Admitting: Family Medicine

## 2016-05-18 NOTE — Telephone Encounter (Signed)
No answer, LMOVM. Called to confirm appointment and advise patient to arrive early for check-in/bring in all current medications.

## 2016-05-19 ENCOUNTER — Ambulatory Visit: Payer: Medicaid Other | Admitting: Family Medicine

## 2016-05-25 NOTE — Telephone Encounter (Signed)
Called to confirm appointment for 05/26/16. Patient was advised to arrive early for check-in and to bring any current medications. No further concerns at this time. °

## 2016-05-26 ENCOUNTER — Encounter: Payer: Self-pay | Admitting: Family Medicine

## 2016-05-26 ENCOUNTER — Ambulatory Visit (INDEPENDENT_AMBULATORY_CARE_PROVIDER_SITE_OTHER): Payer: Self-pay | Admitting: Family Medicine

## 2016-05-26 DIAGNOSIS — K529 Noninfective gastroenteritis and colitis, unspecified: Secondary | ICD-10-CM | POA: Insufficient documentation

## 2016-05-26 DIAGNOSIS — M25552 Pain in left hip: Secondary | ICD-10-CM

## 2016-05-26 HISTORY — DX: Noninfective gastroenteritis and colitis, unspecified: K52.9

## 2016-05-26 MED ORDER — ONDANSETRON 4 MG PO TBDP: 4.0000 mg | ORAL_TABLET | Freq: Three times a day (TID) | ORAL | 0 refills | Status: DC | PRN

## 2016-05-26 MED ORDER — NAPROXEN 500 MG PO TABS: 500.0000 mg | ORAL_TABLET | Freq: Two times a day (BID) | ORAL | 2 refills | Status: DC

## 2016-05-26 NOTE — Assessment & Plan Note (Signed)
Patient is here with signs and symptoms most consistent with viral gastroenteritis. Patient is able to drink water but does not have much of an appetite. No red flag symptoms at this time. Patient appears stable and vital signs are within normal range. Afebrile - Push fluids - Reassurance - Zofran ODT 3 times a day when necessary - Return precautions discussed

## 2016-05-26 NOTE — Patient Instructions (Addendum)
It was a pleasure seeing you today in our clinic. Today we discussed your nausea/vomiting and hip pain. Here is the treatment plan we have discussed and agreed upon together:   Viral gastroenteritis: - Stay well-hydrated with water. - I prescribed to Zofran disintegrating tablets. Place 1 under the tongue to disintegrate when experiencing significant nausea and/or vomiting. This should help control your symptoms. - Begin to integrate bland foods into her diet, these tend to be easier on the stomach. But as long as you are able to keep down water it is okay if your appetite does not develop until later this week.  Chronic hip pain: - I placed an order for naproxen. Take 1 tablet twice a day for the next 5 days. Then take 1 tablet twice a day  AS NEEDED for pain after that. - Take over-the-counter Tylenol to help with pain as well. You may take -  3 times a day. Do not exceed this amount of dosing in a 24 hour period.

## 2016-05-26 NOTE — Assessment & Plan Note (Signed)
Chronic/stable: Patient is to have persistent left-sided hip pain consistent with osteoarthritis at the acetabular joint. No red flag symptoms. Patient does not seem to be taking much medication (OTC) for this pain at this time. Patient was previously seen at pain clinic for this issue but has not been able to go due to loss of insurance. She was also seen by orthopedic surgery and unable to follow-up due to the same issue. - Naproxen 500 mg twice a day 5 days. Then twice a day when necessary after that. - OTC Tylenol [phone] mg 3 times a day when necessary. - Discussed looking into [place] card coverage

## 2016-05-26 NOTE — Progress Notes (Signed)
HPI  CC: Hip pain (chronic, left) and nausea/vomiting (acute) Patient is here to discuss her left-sided hip pain. She states the pain is located along the anterior lateral aspect of her left hip. The majority of her pain presents along the groin and does not radiate. Pain is described as achy. No radiation down the leg. Denies weakness or paresthesias. Endorses some bilateral foot numbness when she is standing for prolonged period of time. She denies any recent trauma or injury. She states that this pain has been present ever since she experienced a fall 1-2 years ago. Patient was previously seen by orthopedic surgery. They recommended surgery however she does not have insurance coverage at this time. Patient is also seen at the pain clinic for this pain but has not been able to go due to a loss in coverage from [facility]. Symptoms are relatively unchanged and typical with her chronic pain. Patient states that she takes an occasional Tylenol for this pain.Patient state Denies bowel/bladder incontinence.  Nausea/vomiting. Patient endorses 4 days of nausea and vomiting. She states that her last episode of emesis was last night. Emesis was always gastric contents without evidence of bilious or bloody contents. Denies coffee-ground appearance. Today she says that she "feels crappy". She has been able to drink but does not have an appetite. Endorses diarrhea over the past 4 days as well. No hematochezia or melena. She has not taken anything for any of her symptoms at this time. Denies fever or chills. No body aches  Review of Systems See HPI for ROS.   CC, SH/smoking status, and VS noted  Objective: BP 122/84   Pulse 77   Temp 98 F (36.7 C) (Oral)   Ht  (1.676 m)   Wt 259 lb 9.6 oz (117.8 kg)   LMP 04/25/2016 (Exact Date)   SpO2 98%   BMI 41.90 kg/m  Gen: NAD, alert, cooperative. HEENT: NCAT, EOMI, PERRL, OP clear, no LAD CV: RRR, no murmur Resp: CTAB, no wheezes, non-labored Abd:  SNTND, BS present, no guarding or organomegaly Ext: No edema, warm. ROM limited in forward flexion, abduction, and adduction of the left hip. Pain elicited with ROM and directed towards the groin. Strength 5/5. Sensation intact throughout. Neuro: Alert and oriented, Speech clear, No gross deficits   Assessment and plan:  Hip pain Chronic/stable: Patient is to have persistent left-sided hip pain consistent with osteoarthritis at the acetabular joint. No red flag symptoms. Patient does not seem to be taking much medication (OTC) for this pain at this time. Patient was previously seen at pain clinic for this issue but has not been able to go due to loss of insurance. She was also seen by orthopedic surgery and unable to follow-up due to the same issue. - Naproxen 500 mg twice a day 5 days. Then twice a day when necessary after that. - OTC Tylenol [phone] mg 3 times a day when necessary. - Discussed looking into [employer] card coverage  Gastroenteritis Patient is here with signs and symptoms most consistent with viral gastroenteritis. Patient is able to drink water but does not have much of an appetite. No red flag symptoms at this time. Patient appears stable and vital signs are within normal range. Afebrile - Push fluids - Reassurance - Zofran ODT 3 times a day when necessary - Return precautions discussed   Meds ordered this encounter  Medications  . naproxen (NAPROSYN) 500 MG tablet    Sig: Take 1 tablet (500 mg total) by mouth  2 (two) times daily with a meal. For 5 days. Then take 1 tablet 2 times a day AS NEEDED after that.    Dispense:  60 tablet    Refill:  2  . ondansetron (ZOFRAN-ODT) 4 MG disintegrating tablet    Sig: Take 1 tablet (4 mg total) by mouth every 8 (eight) hours as needed for nausea or vomiting.    Dispense:  30 tablet    Refill:  0     Kathee Delton, MD,MS,  PGY3 05/26/2016 1:45 PM

## 2016-06-03 MED ORDER — LINACLOTIDE 290 MCG PO CAPS: 290.0000 ug | ORAL_CAPSULE | Freq: Every day | ORAL | 0 refills | Status: DC

## 2016-06-03 MED ORDER — HYDROCHLOROTHIAZIDE 50 MG PO TABS: 25.0000 mg | ORAL_TABLET | Freq: Every day | ORAL | 0 refills | Status: DC

## 2016-06-03 NOTE — Telephone Encounter (Signed)
Unsure what BP medications pt is taking as she was last prescribed clonidine over a year ago.  Clonidine should not be taken inconsistently as it appears she's been taking it.  Its reported she has hydrochlorothiazide on her list. Will start this medication at  (half of what was reported). Pt needs f/u with her PCP within the next year for her BP.  Joanna Puff, MD [facility] Resident  06/03/2016, 2:13 PM

## 2016-06-03 NOTE — Telephone Encounter (Signed)
Pt calling to get refill on blood pressure meds and linzess. Call [phone] when ready to be picked up.  Sunday Spillers, CMA

## 2016-06-04 NOTE — Telephone Encounter (Signed)
Patient informed, expressed understanding. 

## 2016-06-05 ENCOUNTER — Other Ambulatory Visit: Payer: Self-pay | Admitting: Family Medicine

## 2016-06-05 MED ORDER — CLONIDINE HCL 0.2 MG PO TABS: 0.2000 mg | ORAL_TABLET | Freq: Two times a day (BID) | ORAL | 0 refills | Status: DC

## 2016-06-05 NOTE — Telephone Encounter (Signed)
Pt  calling to request refill [id number]  Name of Medication(s):clonidine  Last date of OV:  4-17-8 Pharmacy:  [facility] on [address]  Will route refill request to Clinic RN.  Discussed with patient policy to call pharmacy for future refills.  Also, discussed refills may take up to 48 hours to approve or deny.  Avanell Shackleton

## 2016-06-09 ENCOUNTER — Other Ambulatory Visit: Payer: Self-pay | Admitting: Family Medicine

## 2016-06-09 MED ORDER — SERTRALINE HCL 50 MG PO TABS: 50.0000 mg | ORAL_TABLET | Freq: Every day | ORAL | 3 refills | Status: AC

## 2016-07-24 ENCOUNTER — Other Ambulatory Visit: Payer: Self-pay | Admitting: Family Medicine

## 2016-07-24 MED ORDER — LINACLOTIDE 290 MCG PO CAPS: 290.0000 ug | ORAL_CAPSULE | Freq: Every day | ORAL | 0 refills | Status: AC

## 2016-07-24 NOTE — Telephone Encounter (Signed)
Pt is calling and would like a refill on her Linzess called in. jw

## 2016-08-31 ENCOUNTER — Other Ambulatory Visit: Payer: Self-pay | Admitting: Family Medicine

## 2016-08-31 NOTE — Telephone Encounter (Signed)
Pt calling to request refill of:  Name of Medication(s):  clondidine  Last date of OV:  05-26-16 [facility] [address]   Will route refill request to Clinic RN.  Discussed with patient policy to call pharmacy for future refills.  Also, discussed refills may take up to 48 hours to approve or deny.  Markus JarvisEmily C Pittman

## 2016-09-01 MED ORDER — CLONIDINE HCL 0.2 MG PO TABS: 0.2000 mg | ORAL_TABLET | Freq: Two times a day (BID) | ORAL | 0 refills | Status: DC

## 2017-02-23 ENCOUNTER — Other Ambulatory Visit: Payer: Self-pay

## 2017-02-23 ENCOUNTER — Encounter: Payer: Self-pay | Admitting: Family Medicine

## 2017-02-23 ENCOUNTER — Ambulatory Visit (INDEPENDENT_AMBULATORY_CARE_PROVIDER_SITE_OTHER): Payer: Self-pay | Admitting: Family Medicine

## 2017-02-23 VITALS — BP 142/88 | HR 83 | Temp 98.4°F | Ht 66.0 in | Wt 249.4 lb

## 2017-02-23 DIAGNOSIS — E782 Mixed hyperlipidemia: Secondary | ICD-10-CM

## 2017-02-23 DIAGNOSIS — Z720 Tobacco use: Secondary | ICD-10-CM

## 2017-02-23 DIAGNOSIS — G43709 Chronic migraine without aura, not intractable, without status migrainosus: Secondary | ICD-10-CM | POA: Insufficient documentation

## 2017-02-23 DIAGNOSIS — I10 Essential (primary) hypertension: Secondary | ICD-10-CM

## 2017-02-23 DIAGNOSIS — G43719 Chronic migraine without aura, intractable, without status migrainosus: Secondary | ICD-10-CM

## 2017-02-23 DIAGNOSIS — K219 Gastro-esophageal reflux disease without esophagitis: Secondary | ICD-10-CM

## 2017-02-23 HISTORY — DX: Essential (primary) hypertension: I10

## 2017-02-23 HISTORY — DX: Gastro-esophageal reflux disease without esophagitis: K21.9

## 2017-02-23 HISTORY — DX: Mixed hyperlipidemia: E78.2

## 2017-02-23 HISTORY — DX: Chronic migraine without aura, not intractable, without status migrainosus: G43.709

## 2017-02-23 HISTORY — DX: Chronic migraine without aura, intractable, without status migrainosus: G43.719

## 2017-02-23 MED ORDER — ATORVASTATIN CALCIUM 40 MG PO TABS: 40.0000 mg | ORAL_TABLET | Freq: Every day | ORAL | 3 refills | Status: DC

## 2017-02-23 MED ORDER — HYDROCHLOROTHIAZIDE 50 MG PO TABS: 25.0000 mg | ORAL_TABLET | Freq: Every day | ORAL | 0 refills | Status: DC

## 2017-02-23 MED ORDER — SUMATRIPTAN SUCCINATE 50 MG PO TABS: 50.0000 mg | ORAL_TABLET | ORAL | 0 refills | Status: AC | PRN

## 2017-02-23 MED ORDER — CLONIDINE HCL 0.2 MG PO TABS: 0.2000 mg | ORAL_TABLET | Freq: Two times a day (BID) | ORAL | 0 refills | Status: DC

## 2017-02-23 MED ORDER — TOPIRAMATE 25 MG PO TABS: ORAL_TABLET | ORAL | 3 refills | Status: AC

## 2017-02-23 NOTE — Assessment & Plan Note (Addendum)
Chronic. Controlled. Not taking PPI. Will trial off PPI. - Instructed patient to take H2 blocker if symptoms return

## 2017-02-23 NOTE — Patient Instructions (Signed)
Thank you for coming in to see us today. Please see below to review our plan for today's visit.  1.  Headaches are consistent with migraines.  I have prescribed you to new medications to help with this.  Take the Topamax 25 mg tablet once daily for 1 week and increase to 50 mg daily to help prevent migraines.  If a migraine occurs, try and take a sumatriptan 50 mg tablet and every 2 hours as needed until the migraine improves.  I would like to see you in 1 month to make sure this medication is helping. 2.  I sent in your antihypertensive medications.  Please pick these up and take as prescribed.  Please call the clinic at [phone] if your symptoms worsen or you have any concerns. It was our pleasure to serve you.  Durward Parcelavid McMullen, DO [facility], PGY-2

## 2017-02-23 NOTE — Assessment & Plan Note (Addendum)
Chronic. Uncontrolled. Not taking meds for 2 days.  - Refill for HCTZ 25 mg daily and clonidine 0.2 mg twice daily - Refill Lipitor 40 mg daily

## 2017-02-23 NOTE — Progress Notes (Signed)
Subjective   Patient ID: Sandy Blankenship    DOB: August 10, 1975, 42 y.o. female   MRN: [id number]  CC: "Migraines"  HPI: Sandy Blankenship is a 42 y.o. female who presents to clinic today for the following:  Migraines: Patient has been having headaches for the past 3 years.  She is concerned that these are migraines.  Pain is throbbing behind eyes bilaterally and radiates to occipital region.  Attacks occur twice per week.  She has tried Tylenol and NSAIDs without relief.  She is not tried any abortive therapies or prophylactic migraine therapies.  She denies any auras.  Symptoms improve when she lays down in her bedroom in the dark.  She tends to have photophobia during the attacks.  She is not actively having a migraine attack at present.  Hypertension: Patient has been without antihypertensives for 2 days.  She needs refills on all of her medications.  She is an everyday smoker.  GERD: Patient has been on PPI for over one year.  She has been off medications for a week without reflux.  Patient last seen by Dr. Suezanne JacquetMcCaig on 4/17 concerning pain on left hip and instructed to take naproxen for 5 days.  Discussed looking at orange card coverage.  Was seen 2 months later for suicidal ideation with drug screen consistent with cocaine and cannabis as well as alcohol dependence.  Detox successful.  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  ROS: see HPI for pertinent.  PMFSH: Bipolar disorder type II, asthma, polysubstance abuse disorder (cocaine, THC), tobacco use disorder.  Surgical history unremarkable.  Family history unremarkable.  Smoking status reviewed. Medications reviewed.  Objective   BP (!) 142/88   Pulse 83   Temp 98.4 F (36.9 C) (Oral)   Ht 5\' 6"  (1.676 m)   Wt 249 lb 6.4 oz (113.1 kg)   SpO2 99%   BMI 40.25 kg/m  Vitals and nursing note reviewed.  General: well nourished, well developed, NAD with non-toxic appearance HEENT: normocephalic, atraumatic, moist mucous membranes, PERRLA,  EOMI Cardiovascular: regular rate and rhythm without murmurs, rubs, or gallops Lungs: clear to auscultation bilaterally with normal work of breathing Abdomen: soft, non-tender, non-distended, normoactive bowel sounds Skin: warm, dry, no rashes or lesions, cap refill < 2 seconds Extremities: warm and well perfused, normal tone, no edema Neuro: CNII-XII intact, non-tremulous, without dyarthria  Assessment & Plan   Primary hypertension Chronic. Uncontrolled. Not taking meds for 2 days.  - Refill for HCTZ 25 mg daily and clonidine 0.2 mg twice daily - Refill Lipitor 40 mg daily  Intractable chronic migraine without aura and without status migrainosus Chronic. Uncontrolled. Bilateral though suspicious for migrainous headache. Affects patient weekly. Failed Tylenol and NSAIDs. Neuro exam unremarkable. - Trial Imitrex 50 mg PRN and Topomax 25 mg daily with intent to increase to 50 mg daily after 1 week - RTC 1 month for reassessment of control  GERD (gastroesophageal reflux disease) Chronic. Controlled. Not taking PPI. Will trial off PPI. - Instructed patient to take H2 blocker if symptoms return  No orders of the defined types were placed in this encounter.  Meds ordered this encounter  Medications  . SUMAtriptan (IMITREX) 50 MG tablet    Sig: Take 1 tablet (50 mg total) by mouth every 2 (two) hours as needed for migraine. May repeat in 2 hours if headache persists or recurs.    Dispense:  30 tablet    Refill:  0  . topiramate (TOPAMAX) 25 MG tablet    Sig: Take  1 tablet (25 mg) in the evening, then increase to 2 tablets (50 mg) in the evening after 1 week.    Dispense:  180 tablet    Refill:  3  . atorvastatin (LIPITOR) 40 MG tablet    Sig: Take 1 tablet (40 mg total) by mouth daily.    Dispense:  90 tablet    Refill:  3  . cloNIDine (CATAPRES) 0.2 MG tablet    Sig: Take 1 tablet (0.2 mg total) by mouth 2 (two) times daily.    Dispense:  60 tablet    Refill:  0  .  hydrochlorothiazide (HYDRODIURIL) 50 MG tablet    Sig: Take 0.5 tablets (25 mg total) by mouth daily.    Dispense:  30 tablet    Refill:  0    Durward Parcel, DO [facility], PGY-2 02/23/2017, 8:07 PM

## 2017-02-23 NOTE — Assessment & Plan Note (Deleted)
A 

## 2017-02-23 NOTE — Assessment & Plan Note (Addendum)
Chronic. Uncontrolled. Bilateral though suspicious for migrainous headache. Affects patient weekly. Failed Tylenol and NSAIDs. Neuro exam unremarkable. - Trial Imitrex 50 mg PRN and Topomax 25 mg daily with intent to increase to 50 mg daily after 1 week - RTC 1 month for reassessment of control

## 2017-03-24 ENCOUNTER — Ambulatory Visit: Payer: Medicaid Other | Admitting: Family Medicine

## 2017-04-06 ENCOUNTER — Ambulatory Visit: Payer: Medicaid Other | Admitting: Family Medicine

## 2017-04-13 NOTE — Progress Notes (Deleted)
   Subjective   Patient ID: Sandy Blankenship    DOB: [birth year], 42 y.o. female   MRN: [id number]  CC: "***"  HPI: Sandy Blankenship is a 42 y.o. female who presents to clinic today for the following:  ***: ***  ***Establish care on 1/15 with me.  Has history of migraines and given Imitrex and Topamax.  Hypertension uncontrolled however patient was not taking medications for 2 days.  Given refill of HCTZ 25 mg daily clonidine 0.2 mg twice daily along with Lipitor 40 mg daily.  Given trial off PPI given controlled symptoms.  ROS: see HPI for pertinent.  PMFSH: Bipolar disorder type II, asthma, polysubstance abuse disorder (cocaine, THC), tobacco use disorder.  Surgical history unremarkable.  Family history unremarkable.  Smoking status reviewed. Medications reviewed.  Objective   There were no vitals taken for this visit. Vitals and nursing note reviewed.  General: well nourished, well developed, NAD with non-toxic appearance HEENT: normocephalic, atraumatic, moist mucous membranes Neck: supple, non-tender without lymphadenopathy Cardiovascular: regular rate and rhythm without murmurs, rubs, or gallops Lungs: clear to auscultation bilaterally with normal work of breathing Abdomen: soft, non-tender, non-distended, normoactive bowel sounds Skin: warm, dry, no rashes or lesions, cap refill < 2 seconds Extremities: warm and well perfused, normal tone, no edema  Assessment & Plan   No problem-specific Assessment & Plan notes found for this encounter.  No orders of the defined types were placed in this encounter.  No orders of the defined types were placed in this encounter.   Durward Parcelavid Damany Eastman, DO [facility], PGY-2 04/13/2017, 1:18 PM

## 2017-04-16 ENCOUNTER — Ambulatory Visit: Payer: Medicaid Other | Admitting: Family Medicine

## 2017-04-29 NOTE — Progress Notes (Deleted)
   Subjective   Patient ID: Sandy Blankenship    DOB: 06-18-1975, 42 y.o. female   MRN: [id number]  CC: "***"  HPI: Sandy Blankenship is a 42 y.o. female who presents to clinic today for the following:  ***: ***  ***Last seen by me 02/2017 for migraines. Started on Topamax and Imitrex. RTC today for recheck. Trial off PPI and refilled HTN meds since off for 2 days last visit. Smoker? HIV needs screening. Flu and Tdap?  ROS: see HPI for pertinent.  PMFSH: Bipolar disorder type II, asthma, polysubstance abuse disorder (cocaine, THC), tobacco use disorder.  Surgical history unremarkable.  Family history unremarkable.  Smoking status reviewed. Medications reviewed.  Objective   There were no vitals taken for this visit. Vitals and nursing note reviewed.  General: well nourished, well developed, NAD with non-toxic appearance HEENT: normocephalic, atraumatic, moist mucous membranes Neck: supple, non-tender without lymphadenopathy Cardiovascular: regular rate and rhythm without murmurs, rubs, or gallops Lungs: clear to auscultation bilaterally with normal work of breathing Abdomen: soft, non-tender, non-distended, normoactive bowel sounds Skin: warm, dry, no rashes or lesions, cap refill < 2 seconds Extremities: warm and well perfused, normal tone, no edema  Assessment & Plan   No problem-specific Assessment & Plan notes found for this encounter.  No orders of the defined types were placed in this encounter.  No orders of the defined types were placed in this encounter.   Durward Parcelavid McMullen, DO [facility], PGY-2 04/29/2017, 11:02 AM

## 2017-04-30 ENCOUNTER — Ambulatory Visit: Payer: Medicaid Other | Admitting: Family Medicine

## 2017-06-03 NOTE — Progress Notes (Deleted)
   Subjective   Patient ID: Sandy Blankenship    DOB: 03-Jan-1976, 42 y.o. female   MRN: [id number]  CC: "***"  HPI: Sandy Blankenship is a 42 y.o. female who presents for a same day appointment for the following:  ***  ***Patient was seen by me on 02/23/2017 with uncontrolled HTN.  Even Imitrex and Topamax for migraines.  Here today for pain?  ROS: see HPI for pertinent.  PMFSH: Bipolar disorder type II, asthma, polysubstance abuse disorder (cocaine, THC), tobacco use disorder.  Surgical history unremarkable.  Family history unremarkable. Smoking status reviewed. Medications reviewed.  Objective   There were no vitals taken for this visit. Vitals and nursing note reviewed.  General: well nourished, well developed, NAD with non-toxic appearance HEENT: normocephalic, atraumatic, moist mucous membranes Neck: supple, non-tender without lymphadenopathy Cardiovascular: regular rate and rhythm without murmurs, rubs, or gallops Lungs: clear to auscultation bilaterally with normal work of breathing Abdomen: soft, non-tender, non-distended, normoactive bowel sounds Skin: warm, dry, no rashes or lesions, cap refill < 2 seconds Extremities: warm and well perfused, normal tone, no edema  Assessment & Plan   No problem-specific Assessment & Plan notes found for this encounter.  No orders of the defined types were placed in this encounter.  No orders of the defined types were placed in this encounter.   Durward Parcelavid McMullen, DO [facility], PGY-2 06/03/2017, 10:54 AM

## 2017-06-04 ENCOUNTER — Ambulatory Visit: Payer: Self-pay | Admitting: Family Medicine

## 2018-05-04 ENCOUNTER — Other Ambulatory Visit: Payer: Self-pay | Admitting: *Deleted

## 2018-05-04 DIAGNOSIS — I10 Essential (primary) hypertension: Secondary | ICD-10-CM

## 2018-05-05 ENCOUNTER — Other Ambulatory Visit: Payer: Self-pay | Admitting: *Deleted

## 2018-05-05 DIAGNOSIS — I10 Essential (primary) hypertension: Secondary | ICD-10-CM

## 2018-05-05 MED ORDER — HYDROCHLOROTHIAZIDE 50 MG PO TABS: 25.0000 mg | ORAL_TABLET | Freq: Every day | ORAL | 0 refills | Status: DC

## 2018-08-16 DIAGNOSIS — R1010 Upper abdominal pain, unspecified: Secondary | ICD-10-CM | POA: Insufficient documentation

## 2018-08-16 DIAGNOSIS — K581 Irritable bowel syndrome with constipation: Secondary | ICD-10-CM | POA: Insufficient documentation

## 2018-08-16 DIAGNOSIS — R071 Chest pain on breathing: Secondary | ICD-10-CM | POA: Insufficient documentation

## 2018-08-16 HISTORY — DX: Chest pain on breathing: R07.1

## 2018-08-16 HISTORY — DX: Irritable bowel syndrome with constipation: K58.1

## 2018-08-16 HISTORY — DX: Upper abdominal pain, unspecified: R10.10

## 2018-08-17 DIAGNOSIS — Z96642 Presence of left artificial hip joint: Secondary | ICD-10-CM | POA: Insufficient documentation

## 2018-08-17 HISTORY — DX: Presence of left artificial hip joint: Z96.642

## 2019-01-06 DIAGNOSIS — D1621 Benign neoplasm of long bones of right lower limb: Secondary | ICD-10-CM | POA: Insufficient documentation

## 2019-01-06 DIAGNOSIS — M25861 Other specified joint disorders, right knee: Secondary | ICD-10-CM

## 2019-01-06 HISTORY — DX: Benign neoplasm of long bones of right lower limb: D16.21

## 2019-01-06 HISTORY — DX: Other specified joint disorders, right knee: M25.861

## 2019-02-19 DIAGNOSIS — C419 Malignant neoplasm of bone and articular cartilage, unspecified: Secondary | ICD-10-CM

## 2019-02-19 HISTORY — DX: Malignant neoplasm of bone and articular cartilage, unspecified: C41.9

## 2019-05-03 ENCOUNTER — Encounter (HOSPITAL_BASED_OUTPATIENT_CLINIC_OR_DEPARTMENT_OTHER): Payer: Self-pay

## 2019-05-03 ENCOUNTER — Other Ambulatory Visit: Payer: Self-pay

## 2019-05-03 ENCOUNTER — Emergency Department (HOSPITAL_BASED_OUTPATIENT_CLINIC_OR_DEPARTMENT_OTHER)
Admission: EM | Admit: 2019-05-03 | Discharge: 2019-05-03 | Disposition: A | Discharge status: Home / Self Care | Payer: Medicaid Other | Attending: Emergency Medicine | Admitting: Emergency Medicine

## 2019-05-03 DIAGNOSIS — J45909 Unspecified asthma, uncomplicated: Secondary | ICD-10-CM | POA: Insufficient documentation

## 2019-05-03 DIAGNOSIS — H5711 Ocular pain, right eye: Secondary | ICD-10-CM | POA: Diagnosis not present

## 2019-05-03 DIAGNOSIS — F17210 Nicotine dependence, cigarettes, uncomplicated: Secondary | ICD-10-CM | POA: Insufficient documentation

## 2019-05-03 DIAGNOSIS — I10 Essential (primary) hypertension: Secondary | ICD-10-CM | POA: Diagnosis not present

## 2019-05-03 DIAGNOSIS — Z79899 Other long term (current) drug therapy: Secondary | ICD-10-CM | POA: Insufficient documentation

## 2019-05-03 DIAGNOSIS — R600 Localized edema: Secondary | ICD-10-CM | POA: Diagnosis present

## 2019-05-03 HISTORY — DX: Unspecified asthma, uncomplicated: J45.909

## 2019-05-03 HISTORY — DX: Essential (primary) hypertension: I10

## 2019-05-03 MED ORDER — FLUORESCEIN SODIUM 1 MG OP STRP
1.0000 | ORAL_STRIP | Freq: Once | OPHTHALMIC | Status: AC
  Administered 2019-05-03: 1 via OPHTHALMIC
  Filled 2019-05-03: qty 1

## 2019-05-03 MED ORDER — TETRACAINE HCL 0.5 % OP SOLN
2.0000 [drp] | Freq: Once | OPHTHALMIC | Status: AC
  Administered 2019-05-03: 12:00:00 2 [drp] via OPHTHALMIC
  Filled 2019-05-03: qty 4

## 2019-05-03 NOTE — ED Provider Notes (Signed)
[facility] EMERGENCY DEPARTMENT Provider Note   CSN: [id number] Arrival date & time: 05/03/19  1133     History Chief Complaint  Patient presents with  . Facial Swelling    Sandy Blankenship is a 44 y.o. female with past medical history of bipolar 2, alcohol abuse, hypertension, migraines, glaucoma suspect of both eyes, posterior witnessed detachment of both eyes diagnosed in 2017 by [facility], who presents today for evaluation of right eye pain. She reports that 2 days ago she wore nonprescription collar contacts.  She states she wears them about once a month and it is the brain that she normally wears.  She states that since then her right eye has become significantly more painful and swollen.  She reports that she was unable to fully open the eye when she woke up this morning.  She denies any fevers.  She reports discharge from the eye that is green.  She denies any headaches.  She reports photophobia.  HPI     Past Medical History:  Diagnosis Date  . Asthma   . Hypertension     Patient Active Problem List   Diagnosis Date Noted  . Primary hypertension 02/23/2017  . Intractable chronic migraine without aura and without status migrainosus 02/23/2017  . GERD (gastroesophageal reflux disease) 02/23/2017  . Mixed hyperlipidemia 02/23/2017  . Gastroenteritis 05/26/2016  . Arthritis of left hip 07/19/2015  . Glaucoma suspect of both eyes 07/19/2015  . Posterior vitreous detachment of both eyes 07/19/2015  . Hip pain 06/19/2015  . No blood products 05/27/2015  . Tobacco use 05/27/2015  . History of anemia 05/27/2015  . Bipolar 2 disorder ([place]) 05/27/2015  . Arthritis of knee, right 05/27/2015  . Asthma, chronic 05/27/2015  . History of stomach ulcers 05/27/2015  . S/P tubal ligation 05/27/2015  . History of alcohol abuse 05/27/2015    Past Surgical History:  Procedure Laterality Date  . HIP SURGERY    . KNEE SURGERY       OB History   No obstetric  history on file.     No family history on file.  Social History   Tobacco Use  . Smoking status: Current Every Day Smoker    Packs/day: 0.75    Years: 21.00    Pack years: 15.75    Types: Cigarettes  . Smokeless tobacco: Never Used  Substance Use Topics  . Alcohol use: Yes    Comment: occ  . Drug use: Never    Home Medications Prior to Admission medications   Medication Sig Start Date End Date Taking? Authorizing Provider  albuterol (PROVENTIL HFA;VENTOLIN HFA) 108 (90 Base) MCG/ACT inhaler Inhale 2 puffs into the lungs every 6 (six) hours as needed for wheezing or shortness of breath. 05/27/15   McKeag, Marylynn Pearson, MD  atorvastatin (LIPITOR) 40 MG tablet Take 1 tablet (40 mg total) by mouth daily. 02/23/17   [place] Bing, DO  cloNIDine (CATAPRES) 0.2 MG tablet Take 1 tablet (0.2 mg total) by mouth 2 (two) times daily. 02/23/17   [place] Bing, DO  hydrochlorothiazide (HYDRODIURIL) 50 MG tablet Take 0.5 tablets (25 mg total) by mouth daily. 05/05/18   [place] Bing, DO  hydrOXYzine (VISTARIL) 50 MG capsule Take 1 capsule (50 mg total) by mouth 3 (three) times daily as needed. As needed for anxiety. 05/27/15   McKeag, Marylynn Pearson, MD  linaclotide Rolan Lipa) 290 MCG CAPS capsule Take 1 capsule (290 mcg total) by mouth daily before breakfast. 07/24/16  McKeag, Janine Ores, MD  naltrexone (DEPADE) 50 MG tablet Take 1 tablet (50 mg total) by mouth daily. Patient not taking: Reported on 02/23/2017 05/27/15   McKeag, Janine Ores, MD  naproxen (NAPROSYN) 500 MG tablet Take 1 tablet (500 mg total) by mouth 2 (two) times daily with a meal. For 5 days. Then take 1 tablet 2 times a day AS NEEDED after that. Patient not taking: Reported on 02/23/2017 05/26/16   McKeag, Janine Ores, MD  omeprazole (PRILOSEC) 20 MG capsule Take 20 mg by mouth daily.    [provider]  sertraline (ZOLOFT) 50 MG tablet Take 1 tablet (50 mg total) by mouth daily. 06/09/16   McKeag, Janine Ores, MD  SUMAtriptan (IMITREX) 50 MG tablet Take  1 tablet (50 mg total) by mouth every 2 (two) hours as needed for migraine. May repeat in 2 hours if headache persists or recurs. 02/23/17   Wendee Beavers, DO  topiramate (TOPAMAX) 25 MG tablet Take 1 tablet (25 mg) in the evening, then increase to 2 tablets (50 mg) in the evening after 1 week. 02/23/17   Wendee Beavers, DO    Allergies    Aspirin and Penicillins  Review of Systems   Review of Systems  Constitutional: Negative for fever.  HENT: Positive for facial swelling.   Eyes: Positive for photophobia, pain, discharge, redness and visual disturbance. Negative for itching.  Genitourinary: Negative for dysuria.  Neurological: Negative for weakness and headaches.  All other systems reviewed and are negative.   Physical Exam Updated Vital Signs BP 128/83 (BP Location: Left Arm)   Pulse 71   Temp 98.1 F (36.7 C) (Oral)   Resp 16   Ht 5\' 6"  (1.676 m)   Wt 87.6 kg   SpO2 97%   BMI 31.17 kg/m   Physical Exam Vitals and nursing note reviewed.  Constitutional:      General: She is not in acute distress.    Appearance: She is not ill-appearing.  HENT:     Head: Normocephalic and atraumatic.     Mouth/Throat:     Mouth: Mucous membranes are moist.  Eyes:     General: No scleral icterus.       Right eye: Discharge present.     Comments: There is generalized edema without significant erythema around the right eye, more over the upper lid than the lower lid. The right eye is injected.  There is no pain with ocular range of motion's after application of tetracaine drops. Bilateral eyes are equal round reactive to light.  There is consensual photophobia with pain in the right eye.   Right eye was fluorescein stained without significant uptake.  Negative Seidel's sign.  Cardiovascular:     Rate and Rhythm: Normal rate.  Pulmonary:     Effort: Pulmonary effort is normal. No respiratory distress.  Musculoskeletal:     Cervical back: Normal range of motion. No rigidity.    Neurological:     General: No focal deficit present.     Mental Status: She is alert.  Psychiatric:        Mood and Affect: Mood normal.        Behavior: Behavior normal.     ED Results / Procedures / Treatments   Labs (all labs ordered are listed, but only abnormal results are displayed) Labs Reviewed - No data to display  EKG None  Radiology No results found.  Procedures Procedures (including critical care time)  Medications Ordered in ED Medications  tetracaine (PONTOCAINE) 0.5 % ophthalmic solution 2 drop (2 drops Right Eye Given 05/03/19 1214)  fluorescein ophthalmic strip 1 strip (1 strip Right Eye Given 05/03/19 1214)    ED Course  I have reviewed the triage vital signs and the nursing notes.  Pertinent labs & imaging results that were available during my care of the patient were reviewed by me and considered in my medical decision making (see chart for details).    MDM Rules/Calculators/A&P                     Patient presents today for evaluation of right eye pain and swelling for the past 2 days after she used nonprescription color contacts. On exam concerning for iritis versus uveitis as patient has right eye pain/consensual photophobia.  No significant fluorescein uptake. She did have mild pain with extraocular range of motion, however this fully resolved after tetracaine drops, if she had a deeper infection I would not expect tetracaine drops to resolve the symptom.  She is afebrile, nonill appearing.  I spoke with Dr. Allena Katz, on-call for ophthalmology as she has not seen an ophthalmologist in the past 4 years. Dr. Allena Katz will see her in his office at 1 PM this afternoon. Patient is given the address, along with instructions to go directly to Dr. Eliane Decree office.  Patient is instructed not to make any stops along the way.    Patient understands directions and need for immediate follow-up with Dr. Allena Katz.  Return precautions were discussed with patient who  states their understanding.  At the time of discharge patient denied any unaddressed complaints or concerns.  Patient is agreeable for discharge to Dr. Eliane Decree office, she will go POV.   Note: Portions of this report may have been transcribed using voice recognition software. Every effort was made to ensure accuracy; however, inadvertent computerized transcription errors may be present  Final Clinical Impression(s) / ED Diagnoses Final diagnoses:  Acute right eye pain    Rx / DC Orders ED Discharge Orders    None       Norman Clay 05/03/19 1237    Milagros Loll, MD 05/04/19 1606

## 2019-05-03 NOTE — ED Triage Notes (Signed)
Pt states she woke this am with swelling/pain to right eye-denies injury-states grandson has the same sx-NAD-steady gait

## 2019-05-03 NOTE — Discharge Instructions (Addendum)
Go directly to Dr. Eliane Decree office.  You need to be there at 1pm.   Do not make any stops along the way.

## 2019-05-11 DIAGNOSIS — F332 Major depressive disorder, recurrent severe without psychotic features: Secondary | ICD-10-CM

## 2019-05-11 DIAGNOSIS — F1220 Cannabis dependence, uncomplicated: Secondary | ICD-10-CM

## 2019-05-11 DIAGNOSIS — F1020 Alcohol dependence, uncomplicated: Secondary | ICD-10-CM | POA: Insufficient documentation

## 2019-05-11 HISTORY — DX: Cannabis dependence, uncomplicated: F12.20

## 2019-05-11 HISTORY — DX: Major depressive disorder, recurrent severe without psychotic features: F33.2

## 2019-05-11 HISTORY — DX: Alcohol dependence, uncomplicated: F10.20

## 2019-05-15 DIAGNOSIS — I82509 Chronic embolism and thrombosis of unspecified deep veins of unspecified lower extremity: Secondary | ICD-10-CM | POA: Insufficient documentation

## 2019-05-15 DIAGNOSIS — E876 Hypokalemia: Secondary | ICD-10-CM

## 2019-05-15 HISTORY — DX: Chronic embolism and thrombosis of unspecified deep veins of unspecified lower extremity: I82.509

## 2019-05-15 HISTORY — DX: Hypokalemia: E87.6

## 2019-05-16 DIAGNOSIS — T50902A Poisoning by unspecified drugs, medicaments and biological substances, intentional self-harm, initial encounter: Secondary | ICD-10-CM | POA: Insufficient documentation

## 2019-05-16 HISTORY — DX: Poisoning by unspecified drugs, medicaments and biological substances, intentional self-harm, initial encounter: T50.902A

## 2019-07-21 DIAGNOSIS — F1510 Other stimulant abuse, uncomplicated: Secondary | ICD-10-CM | POA: Insufficient documentation

## 2019-07-21 DIAGNOSIS — F1410 Cocaine abuse, uncomplicated: Secondary | ICD-10-CM | POA: Insufficient documentation

## 2019-07-21 HISTORY — DX: Cocaine abuse, uncomplicated: F14.10

## 2019-07-21 HISTORY — DX: Other stimulant abuse, uncomplicated: F15.10

## 2020-01-07 ENCOUNTER — Telehealth: Payer: Self-pay | Admitting: Physician Assistant

## 2020-01-07 NOTE — Telephone Encounter (Signed)
/  I connected by phone with Jenean Lindau and/or patient's caregiver on 01/07/2020 at 2:05 PM to discuss the potential vaccination through our Homebound vaccination initiative.   Prevaccination Checklist for COVID-19 Vaccines  1.  Are you feeling sick today? no  2.  Have you ever received a dose of a COVID-19 vaccine?  yes      If yes, which one? Pfizer, 11/03/19  3.  Have you ever had an allergic reaction: (This would include a severe reaction [ e.g., anaphylaxis] that required treatment with epinephrine or EpiPen or that caused you to go to the hospital.  It would also include an allergic reaction that occurred within 4 hours that caused hives, swelling, or respiratory distress, including wheezing.) A.  A previous dose of COVID-19 vaccine. no  B.  A vaccine or injectable therapy that contains multiple components, one of which is a COVID-19 vaccine component, but it is not known which component elicited the immediate reaction. no  C.  Are you allergic to polyethylene glycol? no  D. Are you allergic to Polysorbate, which is found in some vaccines, film coated tablets and intravenous steroids?  no   4.  Have you ever had an allergic reaction to another vaccine (other than COVID-19 vaccine) or an injectable medication? (This would include a severe reaction [ e.g., anaphylaxis] that required treatment with epinephrine or EpiPen or that caused you to go to the hospital.  It would also include an allergic reaction that occurred within 4 hours that caused hives, swelling, or respiratory distress, including wheezing.)  no   5.  Have you ever had a severe allergic reaction (e.g., anaphylaxis) to something other than a component of the COVID-19 vaccine, or any vaccine or injectable medication?  This would include food, pet, venom, environmental, or oral medication allergies.  no   6.  Have you received any vaccine in the last 14 days? no   7.  Have you ever had a positive test for COVID-19 or has a  doctor ever told you that you had COVID-19?  no   8.  Have you received passive antibody therapy (monoclonal antibodies or convalescent serum) as a treatment for COVID-19? no   9.  Do you have a weakened immune system caused by something such as HIV infection or cancer or do you take immunosuppressive drugs or therapies?  no   10.  Do you have a bleeding disorder or are you taking a blood thinner? no   11.  Are you pregnant or breast-feeding? no   12.  Do you have dermal fillers? no   __________________   This patient is a 44 y.o. female that meets the FDA criteria to receive homebound vaccination. Patient or parent/caregiver understands they have the option to accept or refuse homebound vaccination.  Patient passed the pre-screening checklist and would like to proceed with homebound vaccination.  Based on questionnaire above, I recommend the patient be observed for 15 minutes.  There are an estimated 0 other household members/caregivers who are also interested in receiving the vaccine. Her mother who lives with her and has never had any vaccination.   Pt has a history of alcoholism. She has no transportation and recently had knee surgery, making ambulation difficult.   I will send the patient's information to our scheduling team who will reach out to schedule the patient and potential caregiver/family members for homebound vaccination.    Cline Crock 01/07/2020 2:05 PM

## 2020-01-07 NOTE — Telephone Encounter (Signed)
Called to discuss the homebound Covid-19 vaccination initiative with the patient and/or caregiver.   Unable to leave message left to call back. Invalid number.  Cline Crock PA-C  [facility]

## 2020-01-16 ENCOUNTER — Ambulatory Visit: Payer: Medicaid Other | Attending: Critical Care Medicine

## 2020-01-16 DIAGNOSIS — Z23 Encounter for immunization: Secondary | ICD-10-CM

## 2020-01-16 NOTE — Progress Notes (Signed)
   Covid-19 Vaccination Clinic  Name:  Lauri Purdum    MRN: [id number] DOB: [birth year]  01/16/2020  [name] was observed post Covid-19 immunization for 15 MINUTES without incident. She was provided with Vaccine Information Sheet and instruction to access the V-Safe system.   [name] was instructed to call 911 with any severe reactions post vaccine: Marland Kitchen Difficulty breathing  . Swelling of face and throat  . A fast heartbeat  . A bad rash all over body  . Dizziness and weakness   Immunizations Administered    Name Date Dose VIS Date Route   Pfizer COVID-19 Vaccine 01/16/2020 12:00 PM 0.3 mL 11/29/2019 Intramuscular   Manufacturer: [employer], [employer]   Lot: [id number]   NDC: [address]-5

## 2020-04-21 DIAGNOSIS — E872 Acidosis, unspecified: Secondary | ICD-10-CM

## 2020-04-21 DIAGNOSIS — I471 Supraventricular tachycardia, unspecified: Secondary | ICD-10-CM | POA: Insufficient documentation

## 2020-04-21 DIAGNOSIS — R7989 Other specified abnormal findings of blood chemistry: Secondary | ICD-10-CM | POA: Insufficient documentation

## 2020-04-21 HISTORY — DX: Supraventricular tachycardia, unspecified: I47.10

## 2020-04-21 HISTORY — DX: Other specified abnormal findings of blood chemistry: R79.89

## 2020-04-21 HISTORY — DX: Acidosis, unspecified: E87.20

## 2020-06-18 ENCOUNTER — Ambulatory Visit: Payer: Medicaid Other | Attending: Critical Care Medicine

## 2020-06-18 DIAGNOSIS — Z23 Encounter for immunization: Secondary | ICD-10-CM

## 2020-06-18 NOTE — Progress Notes (Signed)
   Covid-19 Vaccination Clinic  Name:  Sandy Blankenship    MRN: [id number] DOB: Sep 06, 1975  06/18/2020  Ms. Sandy Blankenship was observed post Covid-19 immunization for 15 minutes without incident. She was provided with Vaccine Information Sheet and instruction to access the V-Safe system.   Ms. Sandy Blankenship was instructed to call 911 with any severe reactions post vaccine: Marland Kitchen Difficulty breathing  . Swelling of face and throat  . A fast heartbeat  . A bad rash all over body  . Dizziness and weakness   Immunizations Administered    Name Date Dose VIS Date Route   PFIZER Comrnaty(Gray TOP) Covid-19 Vaccine 06/18/2020 10:00 AM 0.3 mL 01/18/2020 Intramuscular   Manufacturer: [employer], [employer]   Lot: [id number]   NDC: [phone]

## 2020-08-27 ENCOUNTER — Emergency Department (HOSPITAL_BASED_OUTPATIENT_CLINIC_OR_DEPARTMENT_OTHER): Payer: Medicaid Other

## 2020-08-27 ENCOUNTER — Emergency Department (HOSPITAL_BASED_OUTPATIENT_CLINIC_OR_DEPARTMENT_OTHER)
Admission: EM | Admit: 2020-08-27 | Discharge: 2020-08-27 | Disposition: A | Discharge status: Home / Self Care | Payer: Medicaid Other | Attending: Emergency Medicine | Admitting: Emergency Medicine

## 2020-08-27 ENCOUNTER — Encounter (HOSPITAL_BASED_OUTPATIENT_CLINIC_OR_DEPARTMENT_OTHER): Payer: Self-pay | Admitting: *Deleted

## 2020-08-27 ENCOUNTER — Other Ambulatory Visit: Payer: Self-pay

## 2020-08-27 DIAGNOSIS — J45909 Unspecified asthma, uncomplicated: Secondary | ICD-10-CM | POA: Diagnosis not present

## 2020-08-27 DIAGNOSIS — F17210 Nicotine dependence, cigarettes, uncomplicated: Secondary | ICD-10-CM | POA: Diagnosis not present

## 2020-08-27 DIAGNOSIS — Z79899 Other long term (current) drug therapy: Secondary | ICD-10-CM | POA: Diagnosis not present

## 2020-08-27 DIAGNOSIS — I10 Essential (primary) hypertension: Secondary | ICD-10-CM | POA: Insufficient documentation

## 2020-08-27 DIAGNOSIS — R112 Nausea with vomiting, unspecified: Secondary | ICD-10-CM | POA: Diagnosis present

## 2020-08-27 DIAGNOSIS — G43009 Migraine without aura, not intractable, without status migrainosus: Secondary | ICD-10-CM | POA: Insufficient documentation

## 2020-08-27 LAB — BASIC METABOLIC PANEL
Anion gap: 10 (ref 5–15)
BUN: 13 mg/dL (ref 6–20)
CO2: 25 mmol/L (ref 22–32)
Calcium: 8.9 mg/dL (ref 8.9–10.3)
Chloride: 103 mmol/L (ref 98–111)
Creatinine, Ser: 1.11 mg/dL — ABNORMAL HIGH (ref 0.44–1.00)
GFR, Estimated: >60 mL/min (ref >60)
Glucose, Bld: 99 mg/dL (ref 70–99)
Potassium: 3.3 mmol/L — ABNORMAL LOW (ref 3.5–5.1)
Sodium: 138 mmol/L (ref 135–145)

## 2020-08-27 LAB — CBC WITH DIFFERENTIAL/PLATELET
Abs Immature Granulocytes: 0.03 10*3/uL (ref 0.00–0.07)
Basophils Absolute: 0 10*3/uL (ref 0.0–0.1)
Basophils Relative: 1 %
Eosinophils Absolute: 0.2 10*3/uL (ref 0.0–0.5)
Eosinophils Relative: 3 %
HCT: 40.8 % (ref 36.0–46.0)
Hemoglobin: 13.5 g/dL (ref 12.0–15.0)
Immature Granulocytes: 1 %
Lymphocytes Relative: 25 %
Lymphs Abs: 1.3 10*3/uL (ref 0.7–4.0)
MCH: 33.9 pg (ref 26.0–34.0)
MCHC: 33.1 g/dL (ref 30.0–36.0)
MCV: 102.5 fL — ABNORMAL HIGH (ref 80.0–100.0)
Monocytes Absolute: 0.4 10*3/uL (ref 0.1–1.0)
Monocytes Relative: 8 %
Neutro Abs: 3.3 10*3/uL (ref 1.7–7.7)
Neutrophils Relative %: 62 %
Platelets: 273 10*3/uL (ref 150–400)
RBC: 3.98 MIL/uL (ref 3.87–5.11)
RDW: 13.1 % (ref 11.5–15.5)
WBC: 5.3 10*3/uL (ref 4.0–10.5)
nRBC: 0 % (ref 0.0–0.2)

## 2020-08-27 MED ORDER — PROCHLORPERAZINE EDISYLATE 10 MG/2ML IJ SOLN
10.0000 mg | Freq: Once | INTRAMUSCULAR | Status: AC
  Administered 2020-08-27: 10 mg via INTRAVENOUS
  Filled 2020-08-27: qty 2

## 2020-08-27 MED ORDER — DIPHENHYDRAMINE HCL 50 MG/ML IJ SOLN
50.0000 mg | Freq: Once | INTRAMUSCULAR | Status: AC
  Administered 2020-08-27: 50 mg via INTRAVENOUS
  Filled 2020-08-27: qty 1

## 2020-08-27 MED ORDER — DEXAMETHASONE SODIUM PHOSPHATE 10 MG/ML IJ SOLN
10.0000 mg | Freq: Once | INTRAMUSCULAR | Status: AC
  Administered 2020-08-27: 10 mg via INTRAVENOUS
  Filled 2020-08-27: qty 1

## 2020-08-27 MED ORDER — METOCLOPRAMIDE HCL 5 MG/ML IJ SOLN
10.0000 mg | Freq: Once | INTRAMUSCULAR | Status: AC
  Administered 2020-08-27: 10 mg via INTRAVENOUS
  Filled 2020-08-27: qty 2

## 2020-08-27 MED ORDER — SODIUM CHLORIDE 0.9 % IV BOLUS
1000.0000 mL | Freq: Once | INTRAVENOUS | Status: AC
  Administered 2020-08-27: 1000 mL via INTRAVENOUS

## 2020-08-27 NOTE — ED Provider Notes (Signed)
[facility] EMERGENCY DEPARTMENT Provider Note   CSN: [id number] Arrival date & time: 08/27/20  1825     History Chief Complaint  Patient presents with   Migraine    Sandy Blankenship is a 45 y.o. female.  Patient presents with migraine x1 week.  There is associated nausea, vomiting, photophobia.  She has tried Advil, Tylenol, Aleve, Excedrin without relief.  History of migraines, but states this 1 feels different.  She states he hit her head on a cabinet about 2 weeks ago but does not think there is necessarily a relationship year.  She is not having any vision changes.  Headache did not come on acutely, its been gradually worsening.     Past Medical History:  Diagnosis Date   Asthma    Hypertension     Patient Active Problem List   Diagnosis Date Noted   Primary hypertension 02/23/2017   Intractable chronic migraine without aura and without status migrainosus 02/23/2017   GERD (gastroesophageal reflux disease) 02/23/2017   Mixed hyperlipidemia 02/23/2017   Gastroenteritis 05/26/2016   Arthritis of left hip 07/19/2015   Glaucoma suspect of both eyes 07/19/2015   Posterior vitreous detachment of both eyes 07/19/2015   Hip pain 06/19/2015   No blood products 05/27/2015   Tobacco use 05/27/2015   History of anemia 05/27/2015   Bipolar 2 disorder (HCC) 05/27/2015   Arthritis of knee, right 05/27/2015   Asthma, chronic 05/27/2015   History of stomach ulcers 05/27/2015   S/P tubal ligation 05/27/2015   History of alcohol abuse 05/27/2015    Past Surgical History:  Procedure Laterality Date   HIP SURGERY     KNEE SURGERY       OB History   No obstetric history on file.     No family history on file.  Social History   Tobacco Use   Smoking status: Every Day    Packs/day: 1.00    Years: 21.00    Pack years: 21.00    Types: Cigarettes   Smokeless tobacco: Never  Vaping Use   Vaping Use: Never used  Substance Use Topics   Alcohol use: Yes     Comment: occ   Drug use: Never    Home Medications Prior to Admission medications   Medication Sig Start Date End Date Taking? Authorizing Provider  albuterol (PROVENTIL HFA;VENTOLIN HFA) 108 (90 Base) MCG/ACT inhaler Inhale 2 puffs into the lungs every 6 (six) hours as needed for wheezing or shortness of breath. 05/27/15   McKeag, Janine Ores, MD  atorvastatin (LIPITOR) 40 MG tablet Take 1 tablet (40 mg total) by mouth daily. 02/23/17   Durward Parcel, DO  cloNIDine (CATAPRES) 0.2 MG tablet Take 1 tablet (0.2 mg total) by mouth 2 (two) times daily. 02/23/17   Durward Parcel, DO  hydrochlorothiazide (HYDRODIURIL) 50 MG tablet Take 0.5 tablets (25 mg total) by mouth daily. 05/05/18   Durward Parcel, DO  hydrOXYzine (VISTARIL) 50 MG capsule Take 1 capsule (50 mg total) by mouth 3 (three) times daily as needed. As needed for anxiety. 05/27/15   McKeag, Janine Ores, MD  linaclotide Karlene Einstein) 290 MCG CAPS capsule Take 1 capsule (290 mcg total) by mouth daily before breakfast. 07/24/16   McKeag, Janine Ores, MD  naltrexone (DEPADE) 50 MG tablet Take 1 tablet (50 mg total) by mouth daily. Patient not taking: Reported on 02/23/2017 05/27/15   McKeag, Janine Ores, MD  naproxen (NAPROSYN) 500 MG tablet Take 1 tablet (500 mg total) by mouth 2 (two)  times daily with a meal. For 5 days. Then take 1 tablet 2 times a day AS NEEDED after that. Patient not taking: Reported on 02/23/2017 05/26/16   McKeag, Janine Ores, MD  omeprazole (PRILOSEC) 20 MG capsule Take 20 mg by mouth daily.    [provider]  sertraline (ZOLOFT) 50 MG tablet Take 1 tablet (50 mg total) by mouth daily. 06/09/16   McKeag, Janine Ores, MD  SUMAtriptan (IMITREX) 50 MG tablet Take 1 tablet (50 mg total) by mouth every 2 (two) hours as needed for migraine. May repeat in 2 hours if headache persists or recurs. 02/23/17   Durward Parcel, DO  topiramate (TOPAMAX) 25 MG tablet Take 1 tablet (25 mg) in the evening, then increase to 2 tablets (50 mg) in the evening after 1 week.  02/23/17   Durward Parcel, DO    Allergies    Aspirin and Penicillins  Review of Systems   Review of Systems  Eyes:  Positive for photophobia.  Gastrointestinal:  Positive for nausea and vomiting.  Neurological:  Positive for headaches.   Physical Exam Updated Vital Signs BP (!) 110/59 (BP Location: Right Arm)   Pulse 74   Temp 98.5 F (36.9 C) (Oral)   Resp 17   Ht 5\' 6"  (1.676 m)   Wt 103.9 kg   LMP 04/25/2016 (Exact Date)   SpO2 96%   BMI 36.96 kg/m   Physical Exam Vitals and nursing note reviewed. Exam conducted with a chaperone present.  Constitutional:      Appearance: Normal appearance.  HENT:     Head: Normocephalic and atraumatic.  Eyes:     General: No scleral icterus.       Right eye: No discharge.        Left eye: No discharge.     Extraocular Movements: Extraocular movements intact.     Pupils: Pupils are equal, round, and reactive to light.  Cardiovascular:     Rate and Rhythm: Normal rate and regular rhythm.     Pulses: Normal pulses.     Heart sounds: Normal heart sounds. No murmur heard.   No friction rub. No gallop.  Pulmonary:     Effort: Pulmonary effort is normal. No respiratory distress.     Breath sounds: Normal breath sounds.  Abdominal:     General: Abdomen is flat. Bowel sounds are normal. There is no distension.     Palpations: Abdomen is soft.     Tenderness: There is no abdominal tenderness.  Skin:    General: Skin is warm and dry.     Coloration: Skin is not jaundiced.  Neurological:     Mental Status: She is alert. Mental status is at baseline.     Coordination: Coordination normal.     Comments: Cranial nerves II through XII grossly intact.  Grip strength is equal bilaterally.  Lower extremity strength equal bilaterally.  Negative Romberg, normal finger-nose.    ED Results / Procedures / Treatments   Labs (all labs ordered are listed, but only abnormal results are displayed) Labs Reviewed  BASIC METABOLIC PANEL - Abnormal;  Notable for the following components:      Result Value   Potassium 3.3 (*)    Creatinine, Ser 1.11 (*)    All other components within normal limits  CBC WITH DIFFERENTIAL/PLATELET - Abnormal; Notable for the following components:   MCV 102.5 (*)    All other components within normal limits    EKG None  Radiology CT Head Wo  Contrast  Result Date: 08/27/2020 CLINICAL DATA:  Headache, classic migraine EXAM: CT HEAD WITHOUT CONTRAST TECHNIQUE: Contiguous axial images were obtained from the base of the skull through the vertex without intravenous contrast. COMPARISON:  None. FINDINGS: Brain: There is no mass, hemorrhage or extra-axial collection. The size and configuration of the ventricles and extra-axial CSF spaces are normal. The brain parenchyma is normal, without acute or chronic infarction. Vascular: No abnormal hyperdensity of the major intracranial arteries or dural venous sinuses. No intracranial atherosclerosis. Skull: The visualized skull base, calvarium and extracranial soft tissues are normal. Sinuses/Orbits: No fluid levels or advanced mucosal thickening of the visualized paranasal sinuses. No mastoid or middle ear effusion. The orbits are normal. IMPRESSION: Normal head CT. Electronically Signed   By: Deatra Robinson M.D.   On: 08/27/2020 20:10    Procedures Procedures   Medications Ordered in ED Medications  metoCLOPramide (REGLAN) injection 10 mg (10 mg Intravenous Given 08/27/20 2038)  diphenhydrAMINE (BENADRYL) injection 50 mg (50 mg Intravenous Given 08/27/20 2038)  prochlorperazine (COMPAZINE) injection 10 mg (10 mg Intravenous Given 08/27/20 2038)  sodium chloride 0.9 % bolus 1,000 mL ( Intravenous Stopped 08/27/20 2137)  dexamethasone (DECADRON) injection 10 mg (10 mg Intravenous Given 08/27/20 2109)    ED Course  I have reviewed the triage vital signs and the nursing notes.  Pertinent labs & imaging results that were available during my care of the patient were reviewed  by me and considered in my medical decision making (see chart for details).  Clinical Course as of 08/28/20 0003  Tue Aug 27, 2020  2057 Patient reports improvement of migraine.  However, it is still a 7 out of 10.  I will give her a shot of Decadron and see if that resolves it. [HS]  2057 CT Head Wo Contrast No signs of SAH [HS]  2057 CBC with Differential(!) No leukocytosis, no anemia [HS]  2057 Basic metabolic panel(!) Mildly elevated creatinine, but no electrolyte derangement   [HS]    Clinical Course User Index [HS] Theron Arista, PA-C   MDM Rules/Calculators/A&P                           I suspect this is a classic migraine.  She has been vomiting so we will check electrolytes for electrolyte derangement.  I doubt subarachnoid hemorrhage given that this is been gradually worsening, but patient states this is significantly different than her typical migraine presentation and so I will order a CT scan to rule out SAH.  We will try migraine cocktail and reassess.  Labs and imaging reassuring.  Vitals are stable at this time and her migraine has improved.  Patient at this time is appropriate for discharge.  I do not have high suspicion for any type of emergent pathology such as SAH.  There are no focal deficits on exam which is also reassuring.  Final Clinical Impression(s) / ED Diagnoses Final diagnoses:  Migraine without aura and without status migrainosus, not intractable    Rx / DC Orders ED Discharge Orders     None        Theron Arista, PA-C 08/28/20 0004    Virgina Norfolk, DO 08/28/20 [phone]

## 2020-08-27 NOTE — ED Triage Notes (Signed)
C/o " migraine" x 1 week

## 2020-08-27 NOTE — Discharge Instructions (Addendum)
You were seen today for migraine.  Your imaging and labs looked reassuring.  Continue taking Tylenol and ibuprofen as needed.  I would also like you to schedule a follow-up appointment with your primary care physician next week to discuss preventative medicine for migraines.  If things worsen or change please return back to the ED as needed.

## 2021-07-21 ENCOUNTER — Emergency Department (HOSPITAL_COMMUNITY)
Admission: EM | Admit: 2021-07-21 | Discharge: 2021-07-21 | Discharge status: Against Medical Advice | Payer: Medicaid Other | Attending: Emergency Medicine | Admitting: Emergency Medicine

## 2021-07-21 ENCOUNTER — Other Ambulatory Visit: Payer: Self-pay

## 2021-07-21 DIAGNOSIS — R1033 Periumbilical pain: Secondary | ICD-10-CM | POA: Diagnosis present

## 2021-07-21 DIAGNOSIS — Z5321 Procedure and treatment not carried out due to patient leaving prior to being seen by health care provider: Secondary | ICD-10-CM | POA: Insufficient documentation

## 2021-07-21 DIAGNOSIS — K922 Gastrointestinal hemorrhage, unspecified: Secondary | ICD-10-CM | POA: Diagnosis not present

## 2021-07-21 DIAGNOSIS — R5383 Other fatigue: Secondary | ICD-10-CM | POA: Diagnosis not present

## 2021-07-21 LAB — COMPREHENSIVE METABOLIC PANEL
ALT: 26 U/L (ref 0–44)
AST: 23 U/L (ref 15–41)
Albumin: 4 g/dL (ref 3.5–5.0)
Alkaline Phosphatase: 57 U/L (ref 38–126)
Anion gap: 7 (ref 5–15)
BUN: 8 mg/dL (ref 6–20)
CO2: 28 mmol/L (ref 22–32)
Calcium: 9.5 mg/dL (ref 8.9–10.3)
Chloride: 106 mmol/L (ref 98–111)
Creatinine, Ser: 0.95 mg/dL (ref 0.44–1.00)
GFR, Estimated: >60 mL/min (ref >60)
Glucose, Bld: 103 mg/dL — ABNORMAL HIGH (ref 70–99)
Potassium: 3.1 mmol/L — ABNORMAL LOW (ref 3.5–5.1)
Sodium: 141 mmol/L (ref 135–145)
Total Bilirubin: 1.1 mg/dL (ref 0.3–1.2)
Total Protein: 6.6 g/dL (ref 6.5–8.1)

## 2021-07-21 LAB — CBC
HCT: 44.1 % (ref 36.0–46.0)
Hemoglobin: 14.3 g/dL (ref 12.0–15.0)
MCH: 34.8 pg — ABNORMAL HIGH (ref 26.0–34.0)
MCHC: 32.4 g/dL (ref 30.0–36.0)
MCV: 107.3 fL — ABNORMAL HIGH (ref 80.0–100.0)
Platelets: 270 10*3/uL (ref 150–400)
RBC: 4.11 MIL/uL (ref 3.87–5.11)
RDW: 13.3 % (ref 11.5–15.5)
WBC: 6.1 10*3/uL (ref 4.0–10.5)
nRBC: 0 % (ref 0.0–0.2)

## 2021-07-21 LAB — TYPE AND SCREEN
ABO/RH(D): B POS
Antibody Screen: NEGATIVE

## 2021-07-21 LAB — LIPASE, BLOOD: Lipase: 23 U/L (ref 11–51)

## 2021-07-21 NOTE — ED Provider Triage Note (Signed)
Emergency Medicine Provider Triage Evaluation Note  Chasitee Zenker , a 46 y.o. female  was evaluated in triage.  Pt complains of 5 days of abdominal pain and BRBPR.  Feeling mildly fatigued.  Denies syncope or LOC.  Initially had mild nausea, but is since resolved, without vomiting.  Review of Systems  Positive:  Negative: As above  Physical Exam  BP 117/72 (BP Location: Right Arm)   Pulse 87   Temp 98.4 F (36.9 C) (Oral)   Resp 20   LMP 04/25/2016 (Exact Date)   SpO2 96%  Gen:   Awake, no distress   Resp:  Normal effort  MSK:   Moves extremities without difficulty  Other:  Diffuse abdominal tenderness, with emphasis of the suprapubic and umbilical region.  Abdomen soft, nondistended.  Medical Decision Making  Medically screening exam initiated at 5:52 PM.  Appropriate orders placed.  Brandace Jordanbell was informed that the remainder of the evaluation will be completed by another provider, this initial triage assessment does not replace that evaluation, and the importance of remaining in the ED until their evaluation is complete.     Cecil Cobbs, PA-C 07/21/21 1754

## 2021-07-21 NOTE — ED Triage Notes (Signed)
Pt reports periumbilical abdominal pain and BRBPR x 5 days. Hx of same. Has associated n/v/d. Denies blood thinners. Endorses generalized weakness and body aches.

## 2021-07-21 NOTE — ED Notes (Signed)
X2 no response and not visible in the lobby 

## 2021-07-22 DIAGNOSIS — M545 Low back pain, unspecified: Secondary | ICD-10-CM | POA: Insufficient documentation

## 2021-07-22 HISTORY — DX: Low back pain, unspecified: M54.50

## 2021-09-24 DIAGNOSIS — G894 Chronic pain syndrome: Secondary | ICD-10-CM

## 2021-09-24 DIAGNOSIS — G8929 Other chronic pain: Secondary | ICD-10-CM

## 2021-09-24 HISTORY — DX: Other chronic pain: G89.29

## 2021-09-24 HISTORY — DX: Chronic pain syndrome: G89.4

## 2021-10-27 ENCOUNTER — Other Ambulatory Visit: Payer: Self-pay

## 2021-10-27 ENCOUNTER — Encounter (HOSPITAL_BASED_OUTPATIENT_CLINIC_OR_DEPARTMENT_OTHER): Payer: Self-pay | Admitting: Emergency Medicine

## 2021-10-27 ENCOUNTER — Emergency Department (HOSPITAL_BASED_OUTPATIENT_CLINIC_OR_DEPARTMENT_OTHER)
Admission: EM | Admit: 2021-10-27 | Discharge: 2021-10-27 | Disposition: A | Discharge status: Home / Self Care | Payer: Commercial Managed Care - HMO | Attending: Emergency Medicine | Admitting: Emergency Medicine

## 2021-10-27 DIAGNOSIS — R21 Rash and other nonspecific skin eruption: Secondary | ICD-10-CM

## 2021-10-27 MED ORDER — PREDNISONE 10 MG PO TABS: 50.0000 mg | ORAL_TABLET | Freq: Every day | ORAL | 0 refills | Status: DC

## 2021-10-27 MED ORDER — DIPHENHYDRAMINE HCL 25 MG PO CAPS: 25.0000 mg | ORAL_CAPSULE | Freq: Four times a day (QID) | ORAL | 0 refills | Status: AC | PRN

## 2021-10-27 MED ORDER — DIPHENHYDRAMINE HCL 25 MG PO CAPS
25.0000 mg | ORAL_CAPSULE | Freq: Once | ORAL | Status: AC
  Administered 2021-10-27: 25 mg via ORAL
  Filled 2021-10-27: qty 1

## 2021-10-27 MED ORDER — DEXAMETHASONE SODIUM PHOSPHATE 10 MG/ML IJ SOLN
10.0000 mg | Freq: Once | INTRAMUSCULAR | Status: AC
  Administered 2021-10-27: 10 mg via INTRAMUSCULAR
  Filled 2021-10-27: qty 1

## 2021-10-27 NOTE — ED Provider Notes (Signed)
[facility] EMERGENCY DEPARTMENT Provider Note   CSN: [id number] Arrival date & time: 10/27/21  1745     History  Chief Complaint  Patient presents with   Rash    Sandy Blankenship is a 46 y.o. female.  HPI    47 year old female comes in with chief complaint of rash. Patient states that about 4 hours ago she had eruption of rash over her back.  There is some itching and burning type sensation.  She denies any history of skin disorder.  She has no new medications, no new exposures -including no new detergent, soap.  Patient denies any previous history of similar symptoms.  Rash is isolated to her back only.  Home Medications Prior to Admission medications   Medication Sig Start Date End Date Taking? Authorizing Provider  diphenhydrAMINE (BENADRYL) 25 mg capsule Take 1 capsule (25 mg total) by mouth every 6 (six) hours as needed for itching. 10/27/21  Yes Derwood Kaplan, MD  predniSONE (DELTASONE) 10 MG tablet Take 5 tablets (50 mg total) by mouth daily. 10/27/21  Yes Derwood Kaplan, MD      Allergies    Penicillins and Tramadol    Review of Systems   Review of Systems  Physical Exam Updated Vital Signs BP (!) 135/101 (BP Location: Left Arm)   Pulse 71   Temp 98.3 F (36.8 C) (Oral)   Resp 18   Ht 5\' 6"  (1.676 m)   Wt 96.6 kg   SpO2 95%   BMI 34.38 kg/m  Physical Exam Vitals and nursing note reviewed.  Constitutional:      Appearance: She is well-developed.  HENT:     Head: Atraumatic.  Cardiovascular:     Rate and Rhythm: Normal rate.  Pulmonary:     Effort: Pulmonary effort is normal.  Musculoskeletal:     Cervical back: Normal range of motion and neck supple.  Skin:    General: Skin is warm and dry.     Comments: Patient has diffuse papules over the lumbar region of her back. No crusting, no vesicles, no exudates.  Mild erythema appreciated and some of the lesions.  No lesions over the abdominal wall, palms, face, neck, upper extremity   Neurological:     Mental Status: She is alert and oriented to person, place, and time.     ED Results / Procedures / Treatments   Labs (all labs ordered are listed, but only abnormal results are displayed) Labs Reviewed - No data to display  EKG None  Radiology No results found.  Procedures Procedures    Medications Ordered in ED Medications  dexamethasone (DECADRON) injection 10 mg (has no administration in time range)  diphenhydrAMINE (BENADRYL) capsule 25 mg (has no administration in time range)    ED Course/ Medical Decision Making/ A&P                           Medical Decision Making Risk Prescription drug management.  46 year old female comes in with chief complaint of sudden onset rash to her back.  Rash is itchy, painful.  Rash is limited to the lower part of her back only.  No specific allergens/exposures appreciated.  Differential diagnosis includes eczema, tinea, pityaris and allergic reaction/drug eruption.  Patient informed that she has no history of syphilis, therefore that is less likely.  Plan is to advise patient to follow-up with her PCP in 1 week.  We will start her on prednisone and Benadryl right  now.  If her symptoms get worse, she will return to the ER.  Final Clinical Impression(s) / ED Diagnoses Final diagnoses:  Rash and nonspecific skin eruption    Rx / DC Orders ED Discharge Orders          Ordered    predniSONE (DELTASONE) 10 MG tablet  Daily        10/27/21 2132    diphenhydrAMINE (BENADRYL) 25 mg capsule  Every 6 hours PRN        10/27/21 2132              Varney Biles, MD 10/27/21 2142

## 2021-10-27 NOTE — ED Notes (Signed)
Patient states she has a rash on her back. She said she noticed it about 4 hours ago. She states it itches bad but she has refrained from scratching same.

## 2021-10-27 NOTE — ED Triage Notes (Signed)
Patient presents to ED via POV from home. Here with rash to back. Denies shortness of breath or difficulty breathing. Endorses itching to same.

## 2021-10-27 NOTE — ED Notes (Signed)
Patient sitting in room 11.

## 2021-10-27 NOTE — Discharge Instructions (Addendum)
We suspect that your symptoms are because of an allergic reaction or contact dermatitis.  Treatment will be aggressive, was started on prednisone and Benadryl. You may apply topical Vaseline to the area as well.  Please return to the ER if your rash is spreading further.  Otherwise, see your primary care doctor for a repeat evaluation in 1 week to see if further testing is needed.

## 2021-10-28 ENCOUNTER — Encounter (HOSPITAL_BASED_OUTPATIENT_CLINIC_OR_DEPARTMENT_OTHER): Payer: Self-pay | Admitting: *Deleted

## 2021-11-12 ENCOUNTER — Other Ambulatory Visit: Payer: Self-pay

## 2021-11-12 ENCOUNTER — Emergency Department (HOSPITAL_BASED_OUTPATIENT_CLINIC_OR_DEPARTMENT_OTHER): Payer: Commercial Managed Care - HMO

## 2021-11-12 ENCOUNTER — Emergency Department (HOSPITAL_BASED_OUTPATIENT_CLINIC_OR_DEPARTMENT_OTHER)
Admission: EM | Admit: 2021-11-12 | Discharge: 2021-11-12 | Disposition: A | Discharge status: Home / Self Care | Payer: Commercial Managed Care - HMO | Attending: Emergency Medicine | Admitting: Emergency Medicine

## 2021-11-12 ENCOUNTER — Encounter (HOSPITAL_BASED_OUTPATIENT_CLINIC_OR_DEPARTMENT_OTHER): Payer: Self-pay

## 2021-11-12 ENCOUNTER — Other Ambulatory Visit (HOSPITAL_BASED_OUTPATIENT_CLINIC_OR_DEPARTMENT_OTHER): Payer: Self-pay

## 2021-11-12 DIAGNOSIS — J209 Acute bronchitis, unspecified: Secondary | ICD-10-CM | POA: Insufficient documentation

## 2021-11-12 DIAGNOSIS — Z79899 Other long term (current) drug therapy: Secondary | ICD-10-CM | POA: Diagnosis not present

## 2021-11-12 DIAGNOSIS — Z720 Tobacco use: Secondary | ICD-10-CM | POA: Diagnosis not present

## 2021-11-12 DIAGNOSIS — Z20822 Contact with and (suspected) exposure to covid-19: Secondary | ICD-10-CM | POA: Insufficient documentation

## 2021-11-12 DIAGNOSIS — R0602 Shortness of breath: Secondary | ICD-10-CM | POA: Diagnosis present

## 2021-11-12 DIAGNOSIS — I10 Essential (primary) hypertension: Secondary | ICD-10-CM | POA: Insufficient documentation

## 2021-11-12 DIAGNOSIS — R051 Acute cough: Secondary | ICD-10-CM

## 2021-11-12 LAB — CBC
HCT: 43.3 % (ref 36.0–46.0)
Hemoglobin: 14.1 g/dL (ref 12.0–15.0)
MCH: 32.9 pg (ref 26.0–34.0)
MCHC: 32.6 g/dL (ref 30.0–36.0)
MCV: 101.2 fL — ABNORMAL HIGH (ref 80.0–100.0)
Platelets: 323 10*3/uL (ref 150–400)
RBC: 4.28 MIL/uL (ref 3.87–5.11)
RDW: 13.3 % (ref 11.5–15.5)
WBC: 8 10*3/uL (ref 4.0–10.5)
nRBC: 0 % (ref 0.0–0.2)

## 2021-11-12 LAB — BASIC METABOLIC PANEL
Anion gap: 8 (ref 5–15)
BUN: 11 mg/dL (ref 6–20)
CO2: 26 mmol/L (ref 22–32)
Calcium: 9.2 mg/dL (ref 8.9–10.3)
Chloride: 108 mmol/L (ref 98–111)
Creatinine, Ser: 0.82 mg/dL (ref 0.44–1.00)
GFR, Estimated: >60 mL/min (ref >60)
Glucose, Bld: 102 mg/dL — ABNORMAL HIGH (ref 70–99)
Potassium: 4 mmol/L (ref 3.5–5.1)
Sodium: 142 mmol/L (ref 135–145)

## 2021-11-12 LAB — TROPONIN I (HIGH SENSITIVITY): Troponin I (High Sensitivity): 2 ng/L (ref <18)

## 2021-11-12 LAB — SARS CORONAVIRUS 2 BY RT PCR: SARS Coronavirus 2 by RT PCR: NEGATIVE

## 2021-11-12 MED ORDER — KETOROLAC TROMETHAMINE 15 MG/ML IJ SOLN
15.0000 mg | Freq: Once | INTRAMUSCULAR | Status: AC
  Administered 2021-11-12: 15 mg via INTRAVENOUS
  Filled 2021-11-12: qty 1

## 2021-11-12 MED ORDER — ALBUTEROL SULFATE HFA 108 (90 BASE) MCG/ACT IN AERS
2.0000 | INHALATION_SPRAY | Freq: Once | RESPIRATORY_TRACT | Status: AC
  Administered 2021-11-12: 2 via RESPIRATORY_TRACT
  Filled 2021-11-12: qty 6.7

## 2021-11-12 MED ORDER — MORPHINE SULFATE (PF) 2 MG/ML IV SOLN
2.0000 mg | Freq: Once | INTRAVENOUS | Status: AC
  Administered 2021-11-12: 2 mg via INTRAVENOUS
  Filled 2021-11-12: qty 1

## 2021-11-12 MED ORDER — NITROGLYCERIN 0.4 MG SL SUBL
0.4000 mg | SUBLINGUAL_TABLET | SUBLINGUAL | Status: DC | PRN
  Administered 2021-11-12 (×3): 0.4 mg via SUBLINGUAL
  Filled 2021-11-12: qty 1

## 2021-11-12 MED ORDER — HYDROCOD POLI-CHLORPHE POLI ER 10-8 MG/5ML PO SUER
5.0000 mL | Freq: Two times a day (BID) | ORAL | 0 refills | Status: AC | PRN
  Filled 2021-11-12: qty 115, 12d supply, fill #0

## 2021-11-12 MED ORDER — ONDANSETRON HCL 4 MG PO TABS
4.0000 mg | ORAL_TABLET | Freq: Four times a day (QID) | ORAL | 0 refills | Status: DC
  Filled 2021-11-12: qty 12, 3d supply, fill #0

## 2021-11-12 MED ORDER — ONDANSETRON HCL 4 MG/2ML IJ SOLN
4.0000 mg | Freq: Once | INTRAMUSCULAR | Status: AC
  Administered 2021-11-12: 4 mg via INTRAVENOUS
  Filled 2021-11-12: qty 2

## 2021-11-12 NOTE — ED Provider Notes (Signed)
[facility] EMERGENCY DEPARTMENT Provider Note   CSN: [id number] Arrival date & time: 11/12/21  0907     History  Chief Complaint  Patient presents with   Chest Pain   Cough    Khadijha Rupert is a 46 y.o. female with history significant for asthma, hypertension, hyperlipidemia, tobacco abuse, bipolar presents with concern for chest pain, rib pain, coughing, chills, shortness of breath for the last 2 days.  She denies any known sick contacts.  Patient reports that she has used her at home inhaler with minimal relief.  She reports no significant worsening of pain with exertion but has worsening of pain with coughing.  Patient reports pain feels like a pressure, radiates to back, she denies radiation to arms, does endorse some intermittent nausea, vomiting as well as some diarrhea.  She has not had an objective fever at home.  She reports 1/2 pack/day cigarette smoking.  Reviewed history of ACS, does endorse some family history of heart disease with brother requiring open heart surgery, no previous history of stroke.   Chest Pain Associated symptoms: cough   Cough Associated symptoms: chest pain        Home Medications Prior to Admission medications   Medication Sig Start Date End Date Taking? Authorizing Provider  chlorpheniramine-HYDROcodone (TUSSIONEX) 10-8 MG/5ML Take 5 mLs by mouth 2 (two) times daily as needed for cough. 11/12/21  Yes Odaly Peri H, PA-C  ondansetron (ZOFRAN) 4 MG tablet Take 1 tablet (4 mg total) by mouth every 6 (six) hours. 11/12/21  Yes Devonne Kitchen H, PA-C  albuterol (PROVENTIL HFA;VENTOLIN HFA) 108 (90 Base) MCG/ACT inhaler Inhale 2 puffs into the lungs every 6 (six) hours as needed for wheezing or shortness of breath. 05/27/15   McKeag, Marylynn Pearson, MD  atorvastatin (LIPITOR) 40 MG tablet Take 1 tablet (40 mg total) by mouth daily. 02/23/17   Harriet Butte, DO  cloNIDine (CATAPRES) 0.2 MG tablet Take 1 tablet (0.2 mg total) by mouth 2  (two) times daily. 02/23/17   Harriet Butte, DO  diphenhydrAMINE (BENADRYL) 25 mg capsule Take 1 capsule (25 mg total) by mouth every 6 (six) hours as needed for itching. 10/27/21   Varney Biles, MD  hydrochlorothiazide (HYDRODIURIL) 50 MG tablet Take 0.5 tablets (25 mg total) by mouth daily. 05/05/18   Harriet Butte, DO  hydrOXYzine (VISTARIL) 50 MG capsule Take 1 capsule (50 mg total) by mouth 3 (three) times daily as needed. As needed for anxiety. 05/27/15   McKeag, Marylynn Pearson, MD  linaclotide Rolan Lipa) 290 MCG CAPS capsule Take 1 capsule (290 mcg total) by mouth daily before breakfast. 07/24/16   McKeag, Marylynn Pearson, MD  naltrexone (DEPADE) 50 MG tablet Take 1 tablet (50 mg total) by mouth daily. Patient not taking: Reported on 02/23/2017 05/27/15   McKeag, Marylynn Pearson, MD  naproxen (NAPROSYN) 500 MG tablet Take 1 tablet (500 mg total) by mouth 2 (two) times daily with a meal. For 5 days. Then take 1 tablet 2 times a day AS NEEDED after that. Patient not taking: Reported on 02/23/2017 05/26/16   McKeag, Marylynn Pearson, MD  omeprazole (PRILOSEC) 20 MG capsule Take 20 mg by mouth daily.    [provider]  predniSONE (DELTASONE) 10 MG tablet Take 5 tablets (50 mg total) by mouth daily. 10/27/21   Varney Biles, MD  sertraline (ZOLOFT) 50 MG tablet Take 1 tablet (50 mg total) by mouth daily. 06/09/16   McKeag, Marylynn Pearson, MD  SUMAtriptan (IMITREX) 50 MG tablet Take  1 tablet (50 mg total) by mouth every 2 (two) hours as needed for migraine. May repeat in 2 hours if headache persists or recurs. 02/23/17   Harriet Butte, DO  topiramate (TOPAMAX) 25 MG tablet Take 1 tablet (25 mg) in the evening, then increase to 2 tablets (50 mg) in the evening after 1 week. 02/23/17   Harriet Butte, DO      Allergies    Penicillins, Tramadol, Tramadol, Aspirin, and Penicillins    Review of Systems   Review of Systems  Respiratory:  Positive for cough.   Cardiovascular:  Positive for chest pain.  All other systems reviewed and are  negative.   Physical Exam Updated Vital Signs BP 117/68   Pulse 79   Temp 98.3 F (36.8 C) (Oral)   Resp 18   Ht 5\' 6"  (1.676 m)   Wt 96.6 kg   LMP 04/25/2016 (Exact Date)   SpO2 94%   BMI 34.38 kg/m  Physical Exam Vitals and nursing note reviewed.  Constitutional:      General: She is not in acute distress.    Appearance: Normal appearance.  HENT:     Head: Normocephalic and atraumatic.  Eyes:     General:        Right eye: No discharge.        Left eye: No discharge.  Cardiovascular:     Rate and Rhythm: Normal rate and regular rhythm.     Heart sounds: No murmur heard.    No friction rub. No gallop.  Pulmonary:     Effort: Pulmonary effort is normal.     Breath sounds: Normal breath sounds.     Comments: Mild and expiratory wheeze, no focal consolidation, scattered dry coughing, Abdominal:     General: Bowel sounds are normal.     Palpations: Abdomen is soft.  Skin:    General: Skin is warm and dry.     Capillary Refill: Capillary refill takes less than 2 seconds.  Neurological:     Mental Status: She is alert and oriented to person, place, and time.  Psychiatric:        Mood and Affect: Mood normal.        Behavior: Behavior normal.     ED Results / Procedures / Treatments   Labs (all labs ordered are listed, but only abnormal results are displayed) Labs Reviewed  CBC - Abnormal; Notable for the following components:      Result Value   MCV 101.2 (*)    All other components within normal limits  BASIC METABOLIC PANEL - Abnormal; Notable for the following components:   Glucose, Bld 102 (*)    All other components within normal limits  SARS CORONAVIRUS 2 BY RT PCR  TROPONIN I (HIGH SENSITIVITY)    EKG EKG Interpretation  Date/Time:  Wednesday November 12 2021 09:25:45 EDT Ventricular Rate:  75 PR Interval:  144 QRS Duration: 82 QT Interval:  358 QTC Calculation: 399 R Axis:   35 Text Interpretation: Normal sinus rhythm No previous ECGs  available Confirmed by Lennice Sites ([id number]) on 11/12/2021 9:28:33 AM  Radiology DG Chest Portable 1 View  Result Date: 11/12/2021 CLINICAL DATA:  Chest and rib pain with coughing, chills and shortness of breath for a while. EXAM: PORTABLE CHEST 1 VIEW COMPARISON:  Radiographs 12/05/2020 and 04/21/2020. FINDINGS: 0934 hours. The heart size and mediastinal contours are normal. The lungs are clear. There is no pleural effusion or pneumothorax. No acute osseous findings are identified.  IMPRESSION: No evidence of active cardiopulmonary process. Electronically Signed   By: Richardean Sale M.D.   On: 11/12/2021 10:13    Procedures Procedures    Medications Ordered in ED Medications  nitroGLYCERIN (NITROSTAT) SL tablet 0.4 mg (0.4 mg Sublingual Given 11/12/21 1032)  albuterol (VENTOLIN HFA) 108 (90 Base) MCG/ACT inhaler 2 puff (2 puffs Inhalation Given 11/12/21 0951)  morphine (PF) 2 MG/ML injection 2 mg (2 mg Intravenous Given 11/12/21 1018)  ketorolac (TORADOL) 15 MG/ML injection 15 mg (15 mg Intravenous Given 11/12/21 1015)  ondansetron (ZOFRAN) injection 4 mg (4 mg Intravenous Given 11/12/21 1015)    ED Course/ Medical Decision Making/ A&P                           Medical Decision Making Amount and/or Complexity of Data Reviewed Labs: ordered. Radiology: ordered.  Risk Prescription drug management.   This patient is a 46 y.o. female who presents to the ED for concern of chest pain, cough, this involves an extensive number of treatment options, and is a complaint that carries with it a high risk of complications and morbidity. The emergent differential diagnosis prior to evaluation includes, but is not limited to,  ACS, AAS, PE, Mallory-Weiss, Boerhaave's, Pneumonia, acute bronchitis, asthma or COPD exacerbation, anxiety, MSK pain or traumatic injury to the chest, acid reflux versus other .   This is not an exhaustive differential.   Past Medical History / Co-morbidities / Social  History: Hypertension, hyperlipidemia, obesity, tobacco use  Additional history: Chart reviewed. Pertinent results include: Lab work, imaging from recent previous emergency department visits, patient was recently placed on steroids secondary to a rash, reports that she is still taking them, although I do not think that she would have been given a 20-day course of steroids, no quantity is listed on her recent prescription  Physical Exam: Physical exam performed. The pertinent findings include: Patient with mild end expiratory wheezing, and dry cough, no focal consolidation, rhonchi, respiratory distress.  Some minimal tenderness palpation in central chest  Lab Tests: I ordered, and personally interpreted labs.  The pertinent results include: COVID test   Imaging Studies: I ordered imaging studies including plain film chest xray. I independently visualized and interpreted imaging which showed no acute intrathoracic abnormality. I agree with the radiologist interpretation.   Cardiac Monitoring:  The patient was maintained on a cardiac monitor.  My attending physician Dr. Ronnald Nian viewed and interpreted the cardiac monitored which showed an underlying rhythm of: Nonspecific T wave inversions, no other signs of acute ischemia. I agree with this interpretation.   Medications: I ordered medication including morphine, Zofran, Toradol, albuterol for chest pain, wheezing. Reevaluation of the patient after these medicines showed that the patient improved. I have reviewed the patients home medicines and have made adjustments as needed.   Disposition: After consideration of the diagnostic results and the patients response to treatment, I feel that patient symptoms are consistent with mild asthma exacerbation versus acute bronchitis from viral illness, I have low clinical suspicion for cardiac chest pain as chest pain is worse with coughing, does not seem to be exertional in nature.   emergency department  workup does not suggest an emergent condition requiring admission or immediate intervention beyond what has been performed at this time. The plan is: Discharge with Tussionex, continued prednisone, and breathing treatment, close PCP follow-up.  Atypical presentation for possible ACS, heart score of 2. the patient is safe for discharge and  has been instructed to return immediately for worsening symptoms, change in symptoms or any other concerns.  I discussed this case with my attending physician Dr. Ronnald Nian who cosigned this note including patient's presenting symptoms, physical exam, and planned diagnostics and interventions. Attending physician stated agreement with plan or made changes to plan which were implemented.    Final Clinical Impression(s) / ED Diagnoses Final diagnoses:  Acute bronchitis, unspecified organism  Acute cough    Rx / DC Orders ED Discharge Orders          Ordered    chlorpheniramine-HYDROcodone (TUSSIONEX) 10-8 MG/5ML  2 times daily PRN        11/12/21 1126    ondansetron (ZOFRAN) 4 MG tablet  Every 6 hours        11/12/21 1127              Shannin Naab, [place] H, PA-C 11/12/21 Bassett, [place], DO 11/12/21 1201

## 2021-11-12 NOTE — ED Triage Notes (Signed)
C/o chest pain, rib pain, coughing, chills, shortness of breath "for a while"

## 2021-11-12 NOTE — Discharge Instructions (Addendum)
Continue your at home breathing treatments and complete the course of steroids that you are already taking, you can use this cough medicine to help with the cough and pain from coughing. Please use Tylenol for pain.  You may use 1000 mg of Tylenol every 6 hours.  Not to exceed 4 g of Tylenol within 24 hours.  I recommend following up with your primary care doctor in the next week to ensure resolution of your symptoms.  If your symptoms significantly worsen, you develop worsening chest pain please return to the emergency department for further evaluation.  Additionally I sent in some nausea medication since you have been having some intermittent nausea.  You can use this as needed.

## 2022-09-14 ENCOUNTER — Emergency Department (HOSPITAL_BASED_OUTPATIENT_CLINIC_OR_DEPARTMENT_OTHER): Payer: MEDICAID

## 2022-09-14 ENCOUNTER — Emergency Department (HOSPITAL_BASED_OUTPATIENT_CLINIC_OR_DEPARTMENT_OTHER)
Admission: EM | Admit: 2022-09-14 | Discharge: 2022-09-15 | Disposition: A | Discharge status: Home / Self Care | Payer: MEDICAID | Attending: Emergency Medicine | Admitting: Emergency Medicine

## 2022-09-14 ENCOUNTER — Encounter (HOSPITAL_BASED_OUTPATIENT_CLINIC_OR_DEPARTMENT_OTHER): Payer: Self-pay

## 2022-09-14 DIAGNOSIS — J449 Chronic obstructive pulmonary disease, unspecified: Secondary | ICD-10-CM | POA: Insufficient documentation

## 2022-09-14 DIAGNOSIS — F17210 Nicotine dependence, cigarettes, uncomplicated: Secondary | ICD-10-CM | POA: Insufficient documentation

## 2022-09-14 DIAGNOSIS — R079 Chest pain, unspecified: Secondary | ICD-10-CM | POA: Diagnosis present

## 2022-09-14 DIAGNOSIS — J45909 Unspecified asthma, uncomplicated: Secondary | ICD-10-CM | POA: Diagnosis not present

## 2022-09-14 DIAGNOSIS — Z79899 Other long term (current) drug therapy: Secondary | ICD-10-CM | POA: Insufficient documentation

## 2022-09-14 DIAGNOSIS — Z1152 Encounter for screening for COVID-19: Secondary | ICD-10-CM | POA: Insufficient documentation

## 2022-09-14 DIAGNOSIS — I10 Essential (primary) hypertension: Secondary | ICD-10-CM | POA: Diagnosis not present

## 2022-09-14 DIAGNOSIS — R0602 Shortness of breath: Secondary | ICD-10-CM | POA: Insufficient documentation

## 2022-09-14 HISTORY — DX: Chronic obstructive pulmonary disease, unspecified: J44.9

## 2022-09-14 LAB — CBC WITH DIFFERENTIAL/PLATELET
Abs Immature Granulocytes: 0.03 10*3/uL (ref 0.00–0.07)
Basophils Absolute: 0 10*3/uL (ref 0.0–0.1)
Basophils Relative: 1 %
Eosinophils Absolute: 0.2 10*3/uL (ref 0.0–0.5)
Eosinophils Relative: 4 %
HCT: 41.7 % (ref 36.0–46.0)
Hemoglobin: 13.6 g/dL (ref 12.0–15.0)
Immature Granulocytes: 1 %
Lymphocytes Relative: 26 %
Lymphs Abs: 1.4 10*3/uL (ref 0.7–4.0)
MCH: 32.5 pg (ref 26.0–34.0)
MCHC: 32.6 g/dL (ref 30.0–36.0)
MCV: 99.5 fL (ref 80.0–100.0)
Monocytes Absolute: 0.5 10*3/uL (ref 0.1–1.0)
Monocytes Relative: 9 %
Neutro Abs: 3.2 10*3/uL (ref 1.7–7.7)
Neutrophils Relative %: 59 %
Platelets: 271 10*3/uL (ref 150–400)
RBC: 4.19 MIL/uL (ref 3.87–5.11)
RDW: 12.8 % (ref 11.5–15.5)
WBC: 5.4 10*3/uL (ref 4.0–10.5)
nRBC: 0 % (ref 0.0–0.2)

## 2022-09-14 LAB — TROPONIN I (HIGH SENSITIVITY)
Troponin I (High Sensitivity): 3 ng/L (ref <18)
Troponin I (High Sensitivity): 3 ng/L (ref <18)

## 2022-09-14 LAB — BASIC METABOLIC PANEL
Anion gap: 9 (ref 5–15)
BUN: 9 mg/dL (ref 6–20)
CO2: 25 mmol/L (ref 22–32)
Calcium: 9 mg/dL (ref 8.9–10.3)
Chloride: 103 mmol/L (ref 98–111)
Creatinine, Ser: 0.85 mg/dL (ref 0.44–1.00)
GFR, Estimated: >60 mL/min (ref >60)
Glucose, Bld: 124 mg/dL — ABNORMAL HIGH (ref 70–99)
Potassium: 2.8 mmol/L — ABNORMAL LOW (ref 3.5–5.1)
Sodium: 137 mmol/L (ref 135–145)

## 2022-09-14 LAB — RESP PANEL BY RT-PCR (RSV, FLU A&B, COVID)  RVPGX2
Influenza A by PCR: NEGATIVE
Influenza B by PCR: NEGATIVE
Resp Syncytial Virus by PCR: NEGATIVE
SARS Coronavirus 2 by RT PCR: NEGATIVE

## 2022-09-14 MED ORDER — ACETAMINOPHEN 500 MG PO TABS
1000.0000 mg | ORAL_TABLET | Freq: Once | ORAL | Status: AC
  Administered 2022-09-14: 1000 mg via ORAL
  Filled 2022-09-14: qty 2

## 2022-09-14 MED ORDER — OMEPRAZOLE 20 MG PO CPDR: 20.0000 mg | DELAYED_RELEASE_CAPSULE | Freq: Every day | ORAL | 0 refills | Status: AC

## 2022-09-14 MED ORDER — FAMOTIDINE 20 MG PO TABS
20.0000 mg | ORAL_TABLET | Freq: Once | ORAL | Status: AC
  Administered 2022-09-14: 20 mg via ORAL
  Filled 2022-09-14: qty 1

## 2022-09-14 MED ORDER — NITROGLYCERIN 0.4 MG SL SUBL: 0.4000 mg | SUBLINGUAL_TABLET | SUBLINGUAL | Status: DC | PRN

## 2022-09-14 MED ORDER — ASPIRIN 81 MG PO CHEW
324.0000 mg | CHEWABLE_TABLET | Freq: Once | ORAL | Status: AC
  Administered 2022-09-14: 324 mg via ORAL
  Filled 2022-09-14: qty 4

## 2022-09-14 MED ORDER — FENTANYL CITRATE PF 50 MCG/ML IJ SOSY
25.0000 ug | PREFILLED_SYRINGE | Freq: Once | INTRAMUSCULAR | Status: AC
  Administered 2022-09-14: 25 ug via INTRAVENOUS
  Filled 2022-09-14: qty 1

## 2022-09-14 NOTE — ED Triage Notes (Signed)
Pt states she developed CP an hr ago Describes it as someone sitting on her chest. Radiates to left arm and neck +nausea Has hx of COPD and Asthma

## 2022-09-14 NOTE — ED Provider Notes (Signed)
[place] EMERGENCY DEPARTMENT AT [facility] [place] Provider Note   CSN: [id number] Arrival date & time: 09/14/22  2037     History  Chief Complaint  Patient presents with   Chest Pain   Shortness of Breath    Latonja Tickner is a 47 y.o. female.   Chest Pain Associated symptoms: shortness of breath   Shortness of Breath Associated symptoms: chest pain     47 year old female presents emergency department with complaints of chest pain, shortness of breath.  Patient states that she was sitting out on her front porch with a group of friends smoking cigarettes when she developed left-sided chest pressure with radiation to her left neck and down her left arm.  Denies history of similar symptoms in the past.  Reported some associated shortness of breath with episode.  Presented to the emergency department due to concerns for her heart.  Patient states that in the past, has had CT scans of her chest with "high" coronary calcium scans but a unable to see said scan and chart review.  Patient has a history of hypertension, hyperlipidemia, prediabetes, smoking.  Also reports cough worsened over the past 3 days.  Denies any fever, chills, abdominal pain, nausea, vomiting, urinary symptoms, change in bowel habits.  Past medical history significant for hypertension, hyperlipidemia, COPD, asthma, GERD  Home Medications Prior to Admission medications   Medication Sig Start Date End Date Taking? Authorizing Provider  albuterol (PROVENTIL HFA;VENTOLIN HFA) 108 (90 Base) MCG/ACT inhaler Inhale 2 puffs into the lungs every 6 (six) hours as needed for wheezing or shortness of breath. 05/27/15   McKeag, Janine Ores, MD  atorvastatin (LIPITOR) 40 MG tablet Take 1 tablet (40 mg total) by mouth daily. 02/23/17   Durward Parcel, DO  chlorpheniramine-HYDROcodone (TUSSIONEX) 10-8 MG/5ML Take 5 mLs by mouth 2 (two) times daily as needed for cough. 11/12/21   Prosperi, Christian H, PA-C  cloNIDine (CATAPRES) 0.2 MG  tablet Take 1 tablet (0.2 mg total) by mouth 2 (two) times daily. 02/23/17   Durward Parcel, DO  diphenhydrAMINE (BENADRYL) 25 mg capsule Take 1 capsule (25 mg total) by mouth every 6 (six) hours as needed for itching. 10/27/21   Derwood Kaplan, MD  hydrochlorothiazide (HYDRODIURIL) 50 MG tablet Take 0.5 tablets (25 mg total) by mouth daily. 05/05/18   Durward Parcel, DO  hydrOXYzine (VISTARIL) 50 MG capsule Take 1 capsule (50 mg total) by mouth 3 (three) times daily as needed. As needed for anxiety. 05/27/15   McKeag, Janine Ores, MD  linaclotide Karlene Einstein) 290 MCG CAPS capsule Take 1 capsule (290 mcg total) by mouth daily before breakfast. 07/24/16   McKeag, Janine Ores, MD  naltrexone (DEPADE) 50 MG tablet Take 1 tablet (50 mg total) by mouth daily. Patient not taking: Reported on 02/23/2017 05/27/15   McKeag, Janine Ores, MD  naproxen (NAPROSYN) 500 MG tablet Take 1 tablet (500 mg total) by mouth 2 (two) times daily with a meal. For 5 days. Then take 1 tablet 2 times a day AS NEEDED after that. Patient not taking: Reported on 02/23/2017 05/26/16   McKeag, Janine Ores, MD  omeprazole (PRILOSEC) 20 MG capsule Take 20 mg by mouth daily.    [provider]  ondansetron (ZOFRAN) 4 MG tablet Take 1 tablet (4 mg total) by mouth every 6 (six) hours. 11/12/21   Prosperi, Christian H, PA-C  predniSONE (DELTASONE) 10 MG tablet Take 5 tablets (50 mg total) by mouth daily. 10/27/21   Derwood Kaplan, MD  sertraline (  ZOLOFT) 50 MG tablet Take 1 tablet (50 mg total) by mouth daily. 06/09/16   McKeag, Janine Ores, MD  SUMAtriptan (IMITREX) 50 MG tablet Take 1 tablet (50 mg total) by mouth every 2 (two) hours as needed for migraine. May repeat in 2 hours if headache persists or recurs. 02/23/17   Durward Parcel, DO  topiramate (TOPAMAX) 25 MG tablet Take 1 tablet (25 mg) in the evening, then increase to 2 tablets (50 mg) in the evening after 1 week. 02/23/17   Durward Parcel, DO      Allergies    Penicillins, Tramadol, Tramadol, Aspirin, and  Penicillins    Review of Systems   Review of Systems  Respiratory:  Positive for shortness of breath.   Cardiovascular:  Positive for chest pain.  All other systems reviewed and are negative.   Physical Exam Updated Vital Signs BP 105/67 (BP Location: Right Arm)   Pulse 95   Temp 98.2 F (36.8 C)   Resp 17   Ht 5\' 6"  (1.676 m)   Wt 110.2 kg   LMP 04/25/2016 (Exact Date)   SpO2 93%   BMI 39.22 kg/m  Physical Exam Vitals and nursing note reviewed.  Constitutional:      General: She is not in acute distress.    Appearance: She is well-developed.  HENT:     Head: Normocephalic and atraumatic.  Eyes:     Conjunctiva/sclera: Conjunctivae normal.  Cardiovascular:     Rate and Rhythm: Normal rate and regular rhythm.     Pulses: Normal pulses.     Heart sounds: No murmur heard. Pulmonary:     Effort: Pulmonary effort is normal. No respiratory distress.     Breath sounds: Normal breath sounds. No wheezing, rhonchi or rales.  Abdominal:     Palpations: Abdomen is soft.     Tenderness: There is no abdominal tenderness. There is no guarding.  Musculoskeletal:        General: No swelling.     Cervical back: Neck supple.     Right lower leg: No edema.     Left lower leg: No edema.  Skin:    General: Skin is warm and dry.     Capillary Refill: Capillary refill takes less than 2 seconds.  Neurological:     Mental Status: She is alert.  Psychiatric:        Mood and Affect: Mood normal.     ED Results / Procedures / Treatments   Labs (all labs ordered are listed, but only abnormal results are displayed) Labs Reviewed  RESP PANEL BY RT-PCR (RSV, FLU A&B, COVID)  [id number]  CBC WITH DIFFERENTIAL/PLATELET  BASIC METABOLIC PANEL  TROPONIN I (HIGH SENSITIVITY)    EKG None  Radiology DG Chest 2 View  Result Date: 09/14/2022 CLINICAL DATA:  Cough EXAM: CHEST - 2 VIEW COMPARISON:  None Available. FINDINGS: The heart size and mediastinal contours are within normal limits. Both  lungs are clear. The visualized skeletal structures are unremarkable. IMPRESSION: No active cardiopulmonary disease. Electronically Signed   By: Helyn Numbers M.D.   On: 09/14/2022 21:38    Procedures Procedures    Medications Ordered in ED Medications  nitroGLYCERIN (NITROSTAT) SL tablet 0.4 mg (has no administration in time range)  fentaNYL (SUBLIMAZE) injection 25 mcg (has no administration in time range)  aspirin chewable tablet 324 mg (324 mg Oral Given 09/14/22 2138)    ED Course/ Medical Decision Making/ A&P  Medical Decision Making Amount and/or Complexity of Data Reviewed Labs: ordered. Radiology: ordered.  Risk OTC drugs. Prescription drug management.   This patient presents to the ED for concern of chest pain/shortness of breath, this involves an extensive number of treatment options, and is a complaint that carries with it a high risk of complications and morbidity.  The differential diagnosis includes The causes for shortness of breath include but are not limited to Cardiac (AHF, pericardial effusion and tamponade, arrhythmias, ischemia, etc) Respiratory (COPD, asthma, pneumonia, pneumothorax, primary pulmonary hypertension, PE/VQ mismatch) Hematological (anemia)  Co morbidities that complicate the patient evaluation  See HPI   Additional history obtained:  Additional history obtained from EMR External records from outside source obtained and reviewed including hospital records   Lab Tests:  Labs pending  Imaging Studies ordered:  I ordered imaging studies including chest x-ray I independently visualized and interpreted imaging which showed no acute cardiopulmonary abnormalities I agree with the radiologist interpretation   Cardiac Monitoring: / EKG:  The patient was maintained on a cardiac monitor.  I personally viewed and interpreted the cardiac monitored which showed an underlying rhythm of: Sinus rhythm nonspecific  T wave changes   Consultations Obtained:  N/a   Problem List / ED Course / Critical interventions / Medication management  Chest pain I ordered medication including as, nitro   Reevaluation of the patient after these medicines showed that the patient improved I have reviewed the patients home medicines and have made adjustments as needed   Social Determinants of Health:  Cigarette use.  Denies illicit drug use.  Test / Admission - Considered:  Chest pain Vitals signs  within normal range and stable throughout visit. Laboratory/imaging studies significant for: See above 47 year old female presents emergency department with complaints of acute onset left-sided chest pain with radiation to her left neck as well as her left arm.  Patient's workup largely pending at this time.  EKG with sinus rhythm nonspecific T wave changes; no indication for STEMI.  Awaiting lab test for further ACS rule out.  Patient without chest pain radiating to back, pulse deficits, neurologic deficits, significant hypertension; low suspicion for aortic dissection.  Patient without pleuritic pain, tachycardia, tachypnea, risk factors for DVT/PE; low suspicion for PE.  No evidence of pneumothorax, pneumonia on chest x-ray imaging.  Majority of patient workup still pending at time of shift change.  At shift change, patient care have to [facility].  Patient stable upon shift change.         Final Clinical Impression(s) / ED Diagnoses Final diagnoses:  None    Rx / DC Orders ED Discharge Orders     None         Peter Garter, [place] 09/14/22 2222    Glyn Ade, MD 09/14/22 2322

## 2022-09-15 MED ORDER — PANTOPRAZOLE SODIUM 40 MG IV SOLR
40.0000 mg | Freq: Once | INTRAVENOUS | Status: AC
  Administered 2022-09-15: 40 mg via INTRAVENOUS
  Filled 2022-09-15: qty 10

## 2022-09-15 MED ORDER — KETOROLAC TROMETHAMINE 30 MG/ML IJ SOLN
30.0000 mg | Freq: Once | INTRAMUSCULAR | Status: AC
  Administered 2022-09-15: 30 mg via INTRAVENOUS
  Filled 2022-09-15: qty 1

## 2022-09-15 MED ORDER — POTASSIUM CHLORIDE CRYS ER 20 MEQ PO TBCR: 40.0000 meq | EXTENDED_RELEASE_TABLET | Freq: Two times a day (BID) | ORAL | 0 refills | Status: DC

## 2022-09-15 MED ORDER — FAMOTIDINE 20 MG PO TABS: 20.0000 mg | ORAL_TABLET | Freq: Once | ORAL | Status: DC

## 2022-09-15 MED ORDER — PANTOPRAZOLE SODIUM 40 MG PO TBEC: 40.0000 mg | DELAYED_RELEASE_TABLET | Freq: Once | ORAL | Status: DC

## 2022-09-15 NOTE — Discharge Instructions (Addendum)
As discussed, you are imaging is not indicative of any acute coronary syndrome - such as a heart attack.  Sent a referral for cardiologist for you to follow-up with.  The office should call you in the next few days to schedule an appointment.  Prescription for omeprazole has been sent to your pharmacy to take daily to help with the chest pain.  Additionally, I have sent in a prescription for potassium for you to take twice a day for 5 days as your potassium was low on your basic metabolic panel tonight.  Follow-up with your primary care provider in 3 to 5 days for reevaluation reasons.  If you do not have one, there is a phone number discharge paperwork you can call to help get established.  Get help right away if: Your chest pain is worse. You have a cough that gets worse, or you cough up blood. You have very bad (severe) pain in your belly (abdomen). You pass out (faint). You have either of these for no clear reason: Sudden chest discomfort. Sudden discomfort in your arms, back, neck, or jaw. You have shortness of breath at any time. You suddenly start to sweat, or your skin gets clammy. You feel sick to your stomach (nauseous). You throw up (vomit). You suddenly feel lightheaded or dizzy. You feel very weak or tired. Your heart starts to beat fast, or it feels like it is skipping beats.

## 2022-09-15 NOTE — ED Provider Notes (Signed)
  Physical Exam  BP 105/67 (BP Location: Right Arm)   Pulse 95   Temp 98.2 F (36.8 C)   Resp 17   Ht 5\' 6"  (1.676 m)   Wt 110.2 kg   LMP 04/25/2016 (Exact Date)   SpO2 93%   BMI 39.22 kg/m   Physical Exam  Procedures  Procedures  ED Course / MDM   Clinical Course as of 09/15/22 0016  Mon Sep 14, 2022  2303 Evaluated patient personally at bedside.  Her chest pain did not start until 9 PM tonight.  She was asymptomatic prior to that.  Has no history of similar.  Pain is mild at this time.  She is resting comfortably. Heart score 4.  Discussed with family.  They do not want an observation admission.  Will perform serial troponins, second sample pending at this time.  Seems more consistent with gastritis we will treat patient for gastritis recommend follow-up in the outpatient setting with cardiology. [CC]    Clinical Course User Index [CC] Glyn Ade, MD   Medical Decision Making Amount and/or Complexity of Data Reviewed Labs: ordered. Radiology: ordered.  Risk OTC drugs. Prescription drug management.   I took over care of patient from Sherian Maroon, [place], at time of shift change.  Patient is a 47 year old female history of tobacco use, hypertension, and hyperlipidemia who presents to the ED with chest pain that developed 2 hours prior to arrival.  The troponin levels were pending at time of shift change.   Patient's initial troponin was 3 and her second troponin, 2 hours later, was also 3.  I have discussed this patient with my attendings, Dr. Doran Durand, and then Dr. Judd Lien at shift change.   Patient given IV Toradol and Pantoprazole for chest pain, since she reports she when I reevaluated her around midnight.    Stable and safe for discharge home.  Prescriptions for omeprazole and potassium were sent to her pharmacy.  Cardiology referral sent for patient to follow-up with.  Return precautions provided.   Maxwell Marion, PA-C 09/15/22 0100    Glyn Ade,  MD 09/15/22 [phone]

## 2022-10-07 HISTORY — DX: Morbid (severe) obesity due to excess calories: E66.01

## 2022-10-14 DIAGNOSIS — G4730 Sleep apnea, unspecified: Secondary | ICD-10-CM

## 2022-10-14 HISTORY — DX: Sleep apnea, unspecified: G47.30

## 2022-10-28 ENCOUNTER — Other Ambulatory Visit: Payer: Self-pay

## 2022-10-28 DIAGNOSIS — I10 Essential (primary) hypertension: Secondary | ICD-10-CM | POA: Insufficient documentation

## 2022-10-28 DIAGNOSIS — J449 Chronic obstructive pulmonary disease, unspecified: Secondary | ICD-10-CM | POA: Insufficient documentation

## 2022-10-29 ENCOUNTER — Encounter: Payer: Self-pay | Admitting: Cardiology

## 2022-10-29 ENCOUNTER — Ambulatory Visit: Payer: MEDICAID | Attending: Cardiology | Admitting: Cardiology

## 2022-10-29 VITALS — BP 124/72 | HR 80 | Ht 66.0 in | Wt 237.1 lb

## 2022-10-29 DIAGNOSIS — I209 Angina pectoris, unspecified: Secondary | ICD-10-CM

## 2022-10-29 DIAGNOSIS — I10 Essential (primary) hypertension: Secondary | ICD-10-CM | POA: Diagnosis not present

## 2022-10-29 DIAGNOSIS — I259 Chronic ischemic heart disease, unspecified: Secondary | ICD-10-CM

## 2022-10-29 DIAGNOSIS — E669 Obesity, unspecified: Secondary | ICD-10-CM

## 2022-10-29 DIAGNOSIS — F17210 Nicotine dependence, cigarettes, uncomplicated: Secondary | ICD-10-CM

## 2022-10-29 DIAGNOSIS — E782 Mixed hyperlipidemia: Secondary | ICD-10-CM

## 2022-10-29 HISTORY — DX: Angina pectoris, unspecified: I20.9

## 2022-10-29 HISTORY — DX: Obesity, unspecified: E66.9

## 2022-10-29 HISTORY — DX: Nicotine dependence, cigarettes, uncomplicated: F17.210

## 2022-10-29 MED ORDER — ASPIRIN 81 MG PO TBEC
81.0000 mg | DELAYED_RELEASE_TABLET | Freq: Every day | ORAL | 3 refills | Status: AC
Start: 2022-10-29 — End: ?

## 2022-10-29 MED ORDER — NITROGLYCERIN 0.4 MG SL SUBL
0.4000 mg | SUBLINGUAL_TABLET | SUBLINGUAL | 6 refills | Status: DC | PRN
Start: 2022-10-29 — End: 2023-02-17

## 2022-10-29 NOTE — Patient Instructions (Addendum)
Medication Instructions:  Your physician has recommended you make the following change in your medication:   Start taking 81 mg coated aspirin daily.  Use nitroglycerin 1 tablet placed under the tongue at the first sign of chest pain or an angina attack. 1 tablet may be used every 5 minutes as needed, for up to 15 minutes. Do not take more than 3 tablets in 15 minutes. If pain persist call 911 or go to the nearest ED.   *If you need a refill on your cardiac medications before your next appointment, please call your pharmacy*   Lab Work: Your physician recommends that you have a BMP today in the office.   If you have labs (blood work) drawn today and your tests are completely normal, you will receive your results only by: MyChart Message (if you have MyChart) OR A paper copy in the mail If you have any lab test that is abnormal or we need to change your treatment, we will call you to review the results.   Testing/Procedures:   Your cardiac CT will be scheduled at one of the below locations:   [facility] [address] [place], [place] [address] [phone]  If scheduled at [facility], please arrive at the [facility] and Children's Entrance (Entrance C2) of [facility] 30 minutes prior to test start time. You can use the FREE valet parking offered at entrance C (encouraged to control the heart rate for the test)  Proceed to the [facility] Radiology Department (first floor) to check-in and test prep.  All radiology patients and guests should use entrance C2 at [facility], accessed from [facility], even though the hospital's physical address listed is [address]     Please follow these instructions carefully (unless otherwise directed):  Hold all erectile dysfunction medications at least 3 days (72 hrs) prior to test. (Ie viagra, cialis, sildenafil, tadalafil, etc) We will administer nitroglycerin during this exam.    On the Night Before the Test: Be sure to Drink plenty of water. Do not consume any caffeinated/decaffeinated beverages or chocolate 12 hours prior to your test. Do not take any antihistamines 12 hours prior to your test.   On the Day of the Test: Drink plenty of water until 1 hour prior to the test. Do not eat any food 1 hour prior to test. You may take your regular medications prior to the test.  Take metoprolol (Lopressor) two hours prior to test. Take a total of 100 mg (4 tablets) prior to your scan. FEMALES- please wear underwire-free bra if available, avoid dresses & tight clothing      After the Test: Drink plenty of water. After receiving IV contrast, you may experience a mild flushed feeling. This is normal. On occasion, you may experience a mild rash up to 24 hours after the test. This is not dangerous. If this occurs, you can take Benadryl 25 mg and increase your fluid intake. If you experience trouble breathing, this can be serious. If it is severe call 911 IMMEDIATELY. If it is mild, please call our office. If you take any of these medications: Glipizide/Metformin, Avandament, Glucavance, please do not take 48 hours after completing test unless otherwise instructed.  We will call to schedule your test 2-4 weeks out understanding that some insurance companies will need an authorization prior to the service being performed.   For non-scheduling related questions, please contact the cardiac imaging nurse navigator should you have  any questions/concerns: Rockwell Alexandria, Cardiac Imaging Nurse Navigator Larey Brick, Cardiac Imaging Nurse Navigator [facility] Direct Office Dial: [phone]   For scheduling needs, including cancellations and rescheduling, please call [place], [phone].   Your physician has requested that you have an echocardiogram. Echocardiography is a painless test that uses sound waves to create images of your heart. It  provides your doctor with information about the size and shape of your heart and how well your heart's chambers and valves are working. This procedure takes approximately one hour. There are no restrictions for this procedure. Please do NOT wear cologne, perfume, aftershave, or lotions (deodorant is allowed). Please arrive 15 minutes prior to your appointment time.  Your next appointment:   2 month(s)  The format for your next appointment:   In Person  Provider:   Belva Crome, MD   Other Instructions Cardiac CT Angiogram A cardiac CT angiogram is a procedure to look at the heart and the area around the heart. It may be done to help find the cause of chest pains or other symptoms of heart disease. During this procedure, a substance called contrast dye is injected into the blood vessels in the area to be checked. A large X-ray machine, called a CT scanner, then takes detailed pictures of the heart and the surrounding area. The procedure is also sometimes called a coronary CT angiogram, coronary artery scanning, or CTA. A cardiac CT angiogram allows the health care provider to see how well blood is flowing to and from the heart. The health care provider will be able to see if there are any problems, such as: Blockage or narrowing of the coronary arteries in the heart. Fluid around the heart. Signs of weakness or disease in the muscles, valves, and tissues of the heart. Tell a health care provider about: Any allergies you have. This is especially important if you have had a previous allergic reaction to contrast dye. All medicines you are taking, including vitamins, herbs, eye drops, creams, and over-the-counter medicines. Any blood disorders you have. Any surgeries you have had. Any medical conditions you have. Whether you are pregnant or may be pregnant. Any anxiety disorders, chronic pain, or other conditions you have that may increase your stress or prevent you from lying still. What  are the risks? Generally, this is a safe procedure. However, problems may occur, including: Bleeding. Infection. Allergic reactions to medicines or dyes. Damage to other structures or organs. Kidney damage from the contrast dye that is used. Increased risk of cancer from radiation exposure. This risk is low. Talk with your health care provider about: The risks and benefits of testing. How you can receive the lowest dose of radiation. What happens before the procedure? Wear comfortable clothing and remove any jewelry, glasses, dentures, and hearing aids. Follow instructions from your health care provider about eating and drinking. This may include: For 12 hours before the procedure -- avoid caffeine. This includes tea, coffee, soda, energy drinks, and diet pills. Drink plenty of water or other fluids that do not have caffeine in them. Being well hydrated can prevent complications. For 4-6 hours before the procedure -- stop eating and drinking. The contrast dye can cause nausea, but this is less likely if your stomach is empty. Ask your health care provider about changing or stopping your regular medicines. This is especially important if you are taking diabetes medicines, blood thinners, or medicines to treat problems with erections (erectile dysfunction). What happens during the procedure?  Hair on your chest may need to be removed so that small sticky patches called electrodes can be placed on your chest. These will transmit information that helps to monitor your heart during the procedure. An IV will be inserted into one of your veins. You might be given a medicine to control your heart rate during the procedure. This will help to ensure that good images are obtained. You will be asked to lie on an exam table. This table will slide in and out of the CT machine during the procedure. Contrast dye will be injected into the IV. You might feel warm, or you may get a metallic taste in your  mouth. You will be given a medicine called nitroglycerin. This will relax or dilate the arteries in your heart. The table that you are lying on will move into the CT machine tunnel for the scan. The person running the machine will give you instructions while the scans are being done. You may be asked to: Keep your arms above your head. Hold your breath. Stay very still, even if the table is moving. When the scanning is complete, you will be moved out of the machine. The IV will be removed. The procedure may vary among health care providers and hospitals. What can I expect after the procedure? After your procedure, it is common to have: A metallic taste in your mouth from the contrast dye. A feeling of warmth. A headache from the nitroglycerin. Follow these instructions at home: Take over-the-counter and prescription medicines only as told by your health care provider. If you are told, drink enough fluid to keep your urine pale yellow. This will help to flush the contrast dye out of your body. Most people can return to their normal activities right after the procedure. Ask your health care provider what activities are safe for you. It is up to you to get the results of your procedure. Ask your health care provider, or the department that is doing the procedure, when your results will be ready. Keep all follow-up visits as told by your health care provider. This is important. Contact a health care provider if: You have any symptoms of allergy to the contrast dye. These include: Shortness of breath. Rash or hives. A racing heartbeat. Summary A cardiac CT angiogram is a procedure to look at the heart and the area around the heart. It may be done to help find the cause of chest pains or other symptoms of heart disease. During this procedure, a large X-ray machine, called a CT scanner, takes detailed pictures of the heart and the surrounding area after a contrast dye has been injected into  blood vessels in the area. Ask your health care provider about changing or stopping your regular medicines before the procedure. This is especially important if you are taking diabetes medicines, blood thinners, or medicines to treat erectile dysfunction. If you are told, drink enough fluid to keep your urine pale yellow. This will help to flush the contrast dye out of your body. This information is not intended to replace advice given to you by your health care provider. Make sure you discuss any questions you have with your health care provider. Document Revised: 09/21/2018 Document Reviewed: 09/21/2018 Elsevier Patient Education  [employer].  Echocardiogram An echocardiogram is a test that uses sound waves to make images of your heart. This way of making images is often called ultrasound. The images from this test can help find out many things about your  heart, including: The size and shape of your heart. The strength of your heart muscle and how well it's working. The size, thickness, and movement of your heart's walls. How your heart valves are working. Problems such as: A tumor or a growth from an infection around the heart valves. Areas of heart muscle that aren't working well because of poor blood flow or injury from a heart attack. An aneurysm. This is a weak or damaged part of an artery wall. An artery is a blood vessel. Tell a health care provider about: Any allergies you have. All medicines you're taking, including vitamins, herbs, eye drops, creams, and over-the-counter medicines. Any bleeding problems you have. Any surgeries you've had. Any medical problems you have. Whether you're pregnant or may be pregnant. What are the risks? Your health care provider will talk with you about risks. These may include an allergic reaction to IV dye that may be used during the test. What happens before the test? You don't need to do anything to get ready for this test. You may eat  and drink normally. What happens during the test?  You'll take off your clothes from the waist up and put on a hospital gown. Sticky patches called electrodes may be placed on your chest. These will be connected to a machine that monitors your heart rate and rhythm. You'll lie down on a table for the exam. A wand covered in gel will be moved over your chest. Sound waves from the wand will go to your heart and bounce back--or "echo" back. The sound waves will go to a computer that uses them to make images of your heart. The images can be viewed on a monitor. The images will also be recorded on the computer so your provider can look at them later. You may be asked to change positions or hold your breath for a short time. This makes it easier to get different views or better views of your heart. In some cases, you may be given a dye through an IV. The IV is put into one of your veins. This dye can make the areas of your heart easier to see. The procedure may vary among providers and hospitals. What can I expect after the test? You may return to your normal diet, activities, and medicines unless your provider tells you not to. If an IV was placed for the test, it will be removed. It's up to you to get the results of your test. Ask your provider, or the department that's doing the test, when your results will be ready. This information is not intended to replace advice given to you by your health care provider. Make sure you discuss any questions you have with your health care provider. Document Revised: 03/27/2022 Document Reviewed: 03/27/2022 Elsevier Patient Education  2024 [employer].

## 2022-10-29 NOTE — Progress Notes (Signed)
Cardiology Office Note:    Date:  10/29/2022   ID:  Sandy Blankenship, DOB [birth year], MRN [id number]  PCP:  Pcp, No  Cardiologist:  Garwin Brothers, MD   Referring MD: Maxwell Marion, PA-C    ASSESSMENT:    1. Mixed hyperlipidemia   2. Primary hypertension   3. Angina pectoris (HCC)   4. Obesity (BMI 35.0-39.9 without comorbidity)   5. Cigarette smoker    PLAN:    In order of problems listed above:  Primary prevention stressed with the patient.  Importance of compliance with diet medication stressed and patient verbalized standing. Chest pain: Patient's symptoms are very concerning.  They are suggesting of angina pectoris.  Following recommendations were made to the patient.  She was advised to take a coated baby aspirin on a regular basis.  Sublingual nitroglycerin prescription was sent, its protocol and 911 protocol explained and the patient vocalized understanding questions were answered to the patient's satisfaction.  I gave her multiple modalities of evaluation invasive and noninvasive and she prefers CT coronary angiography.  Further recommendations will be made based on the findings of this test. Essential hypertension: Blood pressure stable and diet was emphasized. Mixed dyslipidemia on statin therapy followed by primary care.  Diet emphasized.  Lifestyle modification urged. Obesity: Weight reduction stressed.  Diet emphasized and she promises to do better. Cigarette smoker: I spent 5 minutes with the patient discussing solely about smoking. Smoking cessation was counseled. I suggested to the patient also different medications and pharmacological interventions. Patient is keen to try stopping on its own at this time. He will get back to me if he needs any further assistance in this matter. Patient will be seen in follow-up appointment in 6 months or earlier if the patient has any concerns.    Medication Adjustments/Labs and Tests Ordered: Current medicines are reviewed at  length with the patient today.  Concerns regarding medicines are outlined above.  Orders Placed This Encounter  Procedures   EKG 12-Lead   No orders of the defined types were placed in this encounter.    History of Present Illness:    Sandy Blankenship is a 47 y.o. female who is being seen today for the evaluation of chest pain at the request of Maxwell Marion, PA-C.  Patient is a pleasant 47 year old female.  She has past medical history of essential hypertension, dyslipidemia and cigarette smoking.  She leads a sedentary lifestyle.  She mentions to me that she has been noticing substernal chest tightness.  This may or may not be related to exertion.  No radiation to the neck or to the arms.  She went to the emergency room and was given nitroglycerin and it helped the pain.  At the time of my evaluation, the patient is alert awake oriented and in no distress.  She is here for evaluation of this issue.  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 Uncontrolled. Bilateral though suspicious for migrainous   headache. Affects patient weekly. Failed Tylenol and NSAIDs. Neuro exam   unremarkable.  - Trial Imitrex 50 mg PRN and Topomax 25 mg daily with intent to increase   to 50 mg daily  after 1 week  - RTC 1 month for reassessment of control     Chronic pain syndrome 09/24/2021   Cocaine use disorder (HCC) 07/21/2019   COPD (chronic obstructive pulmonary disease) (HCC)    Elevated troponin I level 04/21/2020   Enchondroma of right femur 01/06/2019   Added automatically from request for surgery [id number]     Gastroenteritis 05/26/2016   GERD (gastroesophageal reflux disease) 02/23/2017   Glaucoma suspect of both eyes 07/19/2015   Per [facility] Note: 07/12/15     Hip pain 06/19/2015   History of alcohol abuse 05/27/2015   Started on naltrexone approximately 02/2015     History of anemia 05/27/2015   Per patient: 2012     History of stomach ulcers 05/27/2015   Per patient     Hypertension    Hypokalemia 05/15/2019   Intractable chronic migraine without aura and without status migrainosus 02/23/2017   Irritable bowel syndrome with constipation 08/16/2018   Lactic acidosis 04/21/2020   Low back pain 07/22/2021   Mass of joint of right knee 01/06/2019   Added automatically from request for surgery [id number]     MDD (major depressive disorder), recurrent severe, 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   Metabolic acidosis 04/21/2020   Mixed hyperlipidemia 02/23/2017   No blood products 05/27/2015   Religious purposes: no blood products desired     Pain of upper abdomen 08/16/2018   Posterior vitreous detachment of both eyes 07/19/2015   Per [facility] note 07/12/15     Primary hypertension 02/23/2017   S/P tubal ligation 05/27/2015   S/p tubal 1999     Severe obesity (BMI 35.0-35.9 with comorbidity) (HCC) 10/07/2022   Sleep disorder breathing 10/14/2022   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      Past Surgical History:  Procedure Laterality Date    HIP SURGERY     KNEE SURGERY      Current Medications: Current Meds  Medication Sig   albuterol (PROVENTIL HFA;VENTOLIN HFA) 108 (90 Base) MCG/ACT inhaler Inhale 2 puffs into the lungs every 6 (six) hours as needed for wheezing or shortness of breath.   amitriptyline (ELAVIL) 25 MG tablet Take 25 mg by mouth at bedtime.   atorvastatin (LIPITOR) 40 MG tablet Take 1 tablet (40 mg total) by mouth daily.   chlorpheniramine-HYDROcodone (TUSSIONEX) 10-8 MG/5ML Take 5 mLs by mouth 2 (two) times daily as needed for cough.   cloNIDine (CATAPRES) 0.2 MG tablet Take 1 tablet (0.2 mg total) by mouth 2 (two) times daily.   dicyclomine (BENTYL) 20 MG tablet Take 20 mg by mouth 4 (four) times daily as needed for spasms.   diphenhydrAMINE (BENADRYL) 25 mg capsule Take 1 capsule (25 mg total) by mouth every 6 (six) hours as needed for itching.   EMGALITY 120 MG/ML SOSY Inject 1 mL  into the skin every 30 (thirty) days.   hydrochlorothiazide (HYDRODIURIL) 50 MG tablet Take 0.5 tablets (25 mg total) by mouth daily.   hydrOXYzine (VISTARIL) 50 MG capsule Take 1 capsule (50 mg total) by mouth 3 (three) times daily as needed. As needed for anxiety.   linaclotide (LINZESS) 290 MCG CAPS capsule Take 1 capsule (290 mcg total) by mouth daily before breakfast.   metoprolol tartrate (LOPRESSOR) 25 MG tablet Take 25 mg by mouth 2 (two) times daily.   naltrexone (DEPADE) 50 MG tablet Take 1 tablet (50 mg total) by mouth daily.   naproxen (NAPROSYN) 500 MG tablet Take 1 tablet (500 mg total) by mouth 2 (two) times daily with a meal. For 5 days. Then take 1 tablet 2 times a day AS NEEDED after that.   omeprazole (PRILOSEC) 20 MG capsule Take 1 capsule (20 mg total) by mouth daily.   ondansetron (ZOFRAN) 4 MG tablet Take 1 tablet (4 mg total) by mouth every 6 (six) hours.   predniSONE (DELTASONE) 10 MG tablet Take 5 tablets (50 mg total) by mouth daily.   sertraline (ZOLOFT) 50 MG tablet Take 1 tablet (50 mg total) by  mouth daily.   SUMAtriptan (IMITREX) 50 MG tablet Take 1 tablet (50 mg total) by mouth every 2 (two) hours as needed for migraine. May repeat in 2 hours if headache persists or recurs.   SYMBICORT 160-4.5 MCG/ACT inhaler Inhale 2 puffs into the lungs 2 (two) times daily.   tiZANidine (ZANAFLEX) 4 MG tablet Take 4 mg by mouth at bedtime.   topiramate (TOPAMAX) 25 MG tablet Take 1 tablet (25 mg) in the evening, then increase to 2 tablets (50 mg) in the evening after 1 week.     Allergies:   Penicillins, Tramadol, Tramadol, Aspirin, and Penicillins   Social History   Socioeconomic History   Marital status: Single    Spouse name: Not on file   Number of children: Not on file   Years of education: Not on file   Highest education level: Not on file  Occupational History   Not on file  Tobacco Use   Smoking status: Former    Average packs/day: 1 pack/day for 21.0 years (21.0 ttl pk-yrs)    Types: Cigarettes    Start date: 2024    Quit date: 37    Years since quitting: 32.7   Smokeless tobacco: Never  Vaping Use   Vaping status: Never Used  Substance and Sexual Activity   Alcohol use: Yes    Comment: occ   Drug use: Never   Sexual activity: Not on file  Other Topics Concern   Not on file  Social History Narrative   ** Merged History Encounter **       Social Determinants of Health   Financial Resource Strain: Medium Risk (12/23/2020)   Received from [facility], [facility]   Overall Financial Resource Strain (CARDIA)    Difficulty of Paying Living Expenses: Somewhat hard  Food Insecurity: Low Risk  (04/28/2022)   Received from [facility]   Hunger Vital Sign    Worried About Running Out of Food in the Last Year: Never true    Ran Out of Food in the Last Year: Never true  Transportation Needs: Not on file (04/28/2022)  Physical Activity: Insufficiently Active (12/23/2020)   Received from [facility], [facility]   Exercise Vital Sign    Days of Exercise per  Week: 1 day    Minutes of Exercise  per Session: 10 min  Stress: No Stress Concern Present (07/30/2021)   Received from [facility], [facility] - Occupational Stress Questionnaire    Feeling of Stress : Only a Hauss  Social Connections: Unknown (06/12/2021)   Received from [facility], [facility]   Social Network    Social Network: Not on file     Family History: The patient's family history includes Heart disease in her brother. There is no history of Hypertension, Cancer, or Diabetes.  ROS:   Please see the history of present illness.    All other systems reviewed and are negative.  EKGs/Labs/Other Studies Reviewed:    The following studies were reviewed today:  EKG Interpretation Date/Time:  Thursday October 29 2022 14:49:38 EDT Ventricular Rate:  80 PR Interval:  150 QRS Duration:  88 QT Interval:  404 QTC Calculation: 465 R Axis:   57  Text Interpretation: Normal sinus rhythm Nonspecific T wave abnormality Prolonged QT When compared with ECG of 14-Sep-2022 20:49, PREVIOUS ECG IS PRESENT Confirmed by Belva Crome [phone]) on 10/29/2022 2:59:58 PM     Recent Labs: 09/14/2022: BUN 9; Creatinine, Ser 0.85; Hemoglobin 13.6; Platelets 271; Potassium 2.8; Sodium 137  Recent Lipid Panel    Component Value Date/Time   CHOL 182 05/27/2015 1533   TRIG 280 (H) 05/27/2015 1533   HDL 36 (L) 05/27/2015 1533   CHOLHDL 5.1 (H) 05/27/2015 1533   VLDL 56 (H) 05/27/2015 1533   LDLCALC 90 05/27/2015 1533    Physical Exam:    VS:  BP 124/72   Pulse 80   Ht 5\' 6"  (1.676 m)   Wt 237 lb 1.9 oz (107.6 kg)   LMP 04/25/2016 (Exact Date)   SpO2 91%   BMI 38.27 kg/m     Wt Readings from Last 3 Encounters:  10/29/22 237 lb 1.9 oz (107.6 kg)  09/14/22 243 lb (110.2 kg)  11/12/21 213 lb (96.6 kg)     GEN: Patient is in no acute distress HEENT: Normal NECK: No JVD; No carotid bruits LYMPHATICS: No lymphadenopathy CARDIAC: S1  S2 regular, 2/6 systolic murmur at the apex. RESPIRATORY:  Clear to auscultation without rales, wheezing or rhonchi  ABDOMEN: Soft, non-tender, non-distended MUSCULOSKELETAL:  No edema; No deformity  SKIN: Warm and dry NEUROLOGIC:  Alert and oriented x 3 PSYCHIATRIC:  Normal affect    Signed, Garwin Brothers, MD  10/29/2022 3:18 PM    [facility]

## 2022-10-30 LAB — BASIC METABOLIC PANEL
BUN/Creatinine Ratio: 10 (ref 9–23)
BUN: 11 mg/dL (ref 6–24)
CO2: 27 mmol/L (ref 20–29)
Calcium: 9.5 mg/dL (ref 8.7–10.2)
Chloride: 104 mmol/L (ref 96–106)
Creatinine, Ser: 1.07 mg/dL — ABNORMAL HIGH (ref 0.57–1.00)
Glucose: 99 mg/dL (ref 70–99)
Potassium: 4.2 mmol/L (ref 3.5–5.2)
Sodium: 146 mmol/L — ABNORMAL HIGH (ref 134–144)
eGFR: 64 mL/min/{1.73_m2} (ref >59)

## 2022-11-04 ENCOUNTER — Telehealth: Payer: Self-pay

## 2022-11-04 NOTE — Telephone Encounter (Signed)
-----   Message from Garwin Brothers sent at 11/04/2022 11:51 AM EDT ----- The results of the study is unremarkable. Please inform patient. I will discuss in detail at next appointment. Cc  primary care/referring physician Garwin Brothers, MD 11/04/2022 11:51 AM

## 2022-11-04 NOTE — Telephone Encounter (Signed)
Left vm to call back

## 2022-11-27 ENCOUNTER — Telehealth (HOSPITAL_COMMUNITY): Payer: Self-pay | Admitting: *Deleted

## 2022-11-27 NOTE — Telephone Encounter (Signed)
Reaching out to patient to offer assistance regarding upcoming cardiac imaging study; pt verbalizes understanding of appt date/time, parking situation and where to check in, pre-test NPO status and medications ordered, and verified current allergies; name and call back number provided for further questions should they arise Sandy Frame RN Navigator Cardiac Imaging [facility] [phone] office [phone] cell  Instructions sent via email per patient request.

## 2022-11-30 ENCOUNTER — Ambulatory Visit (HOSPITAL_COMMUNITY)
Admission: RE | Admit: 2022-11-30 | Discharge: 2022-11-30 | Disposition: A | Discharge status: Home / Self Care | Payer: MEDICAID | Source: Ambulatory Visit | Attending: Cardiology | Admitting: Cardiology

## 2022-11-30 DIAGNOSIS — I209 Angina pectoris, unspecified: Secondary | ICD-10-CM | POA: Insufficient documentation

## 2022-11-30 DIAGNOSIS — I25119 Atherosclerotic heart disease of native coronary artery with unspecified angina pectoris: Secondary | ICD-10-CM

## 2022-11-30 DIAGNOSIS — I259 Chronic ischemic heart disease, unspecified: Secondary | ICD-10-CM | POA: Diagnosis present

## 2022-11-30 MED ORDER — NITROGLYCERIN 0.4 MG SL SUBL
SUBLINGUAL_TABLET | SUBLINGUAL | Status: AC
  Filled 2022-11-30: qty 2

## 2022-11-30 MED ORDER — IOHEXOL 350 MG/ML SOLN
95.0000 mL | Freq: Once | INTRAVENOUS | Status: AC | PRN
  Administered 2022-11-30: 95 mL via INTRAVENOUS

## 2022-11-30 MED ORDER — NITROGLYCERIN 0.4 MG SL SUBL
0.8000 mg | SUBLINGUAL_TABLET | Freq: Once | SUBLINGUAL | Status: AC
  Administered 2022-11-30: 0.8 mg via SUBLINGUAL

## 2022-12-01 ENCOUNTER — Ambulatory Visit (HOSPITAL_BASED_OUTPATIENT_CLINIC_OR_DEPARTMENT_OTHER)
Admission: RE | Admit: 2022-12-01 | Discharge: 2022-12-01 | Disposition: A | Discharge status: Home / Self Care | Payer: MEDICAID | Source: Ambulatory Visit | Attending: Cardiology | Admitting: Cardiology

## 2022-12-01 DIAGNOSIS — I209 Angina pectoris, unspecified: Secondary | ICD-10-CM | POA: Insufficient documentation

## 2022-12-02 LAB — ECHOCARDIOGRAM COMPLETE
AR max vel: 3.62 cm2
AV Area VTI: 3.56 cm2
AV Area mean vel: 3.55 cm2
AV Mean grad: 3 mm[Hg]
AV Peak grad: 6.2 mm[Hg]
Ao pk vel: 1.24 m/s
Area-P 1/2: 2.79 cm2
Calc EF: 60.9 %
S' Lateral: 3.2 cm
Single Plane A2C EF: 63 %
Single Plane A4C EF: 60.6 %

## 2022-12-03 IMAGING — CT CT HEAD W/O CM
3 series · 15 of 47 positions shown, 18 images · non-contrast
Comparison: None.

CLINICAL DATA: Headache, classic migraine

EXAM:
CT HEAD WITHOUT CONTRAST
TECHNIQUE: Contiguous axial images were obtained from the base of the skull
through the vertex without intravenous contrast.

[Series 2: head wo · axial · 0.46mm/px · z∈[-202,-56]mm · 9 of 35 slices shown, 12 images]
[im 3/35  brain]
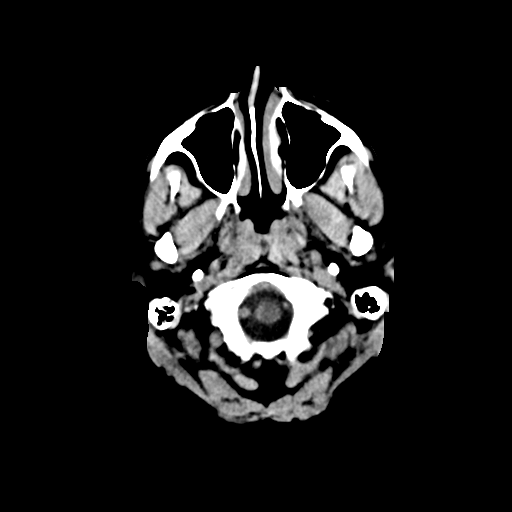
[im 3/35  bone]
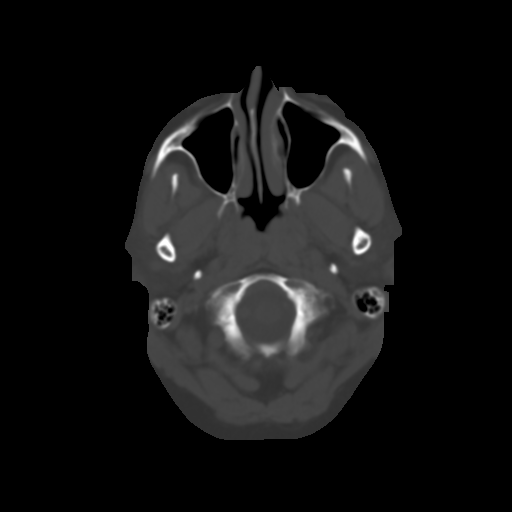
[im 6/35  brain]
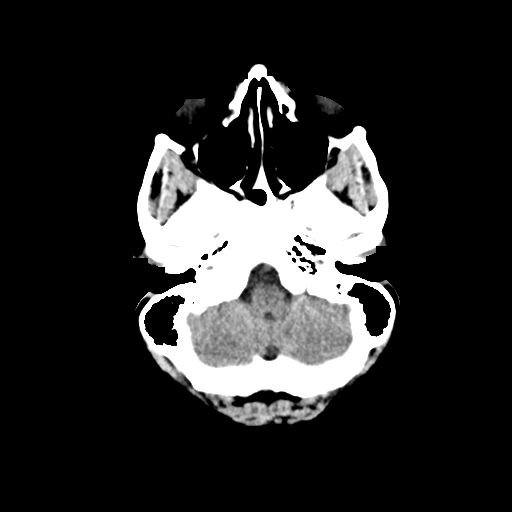
[im 10/35  brain]
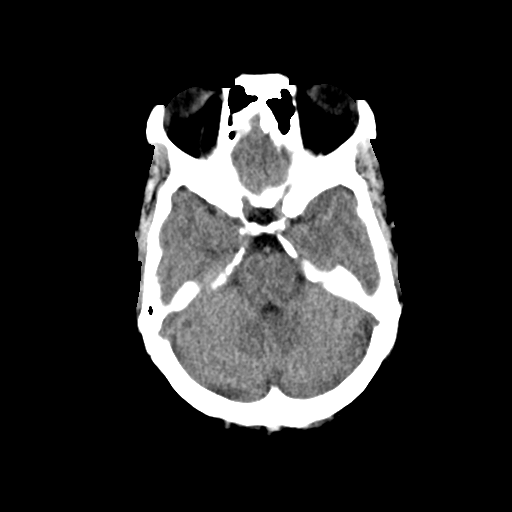
[im 13/35  brain]
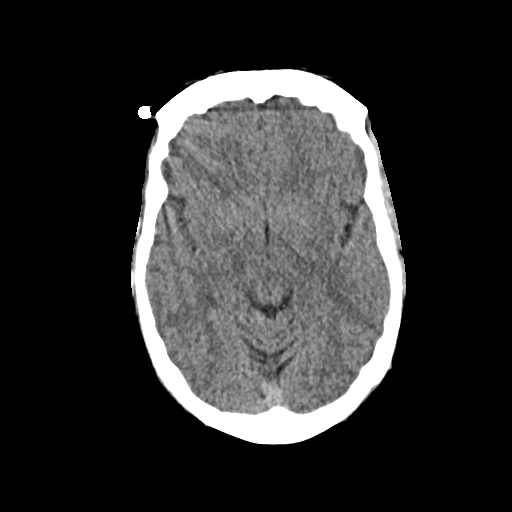
[im 18/35  brain]
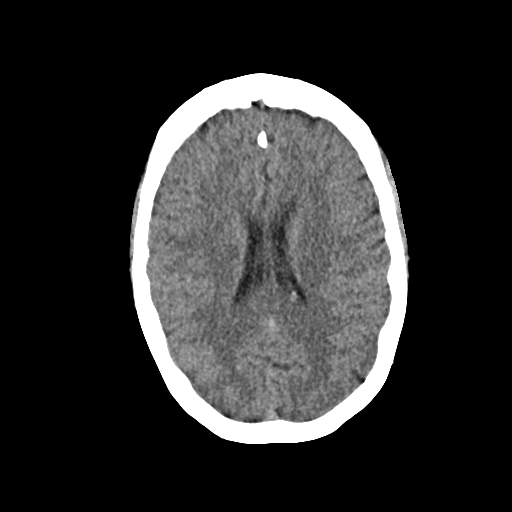
[im 18/35  bone]
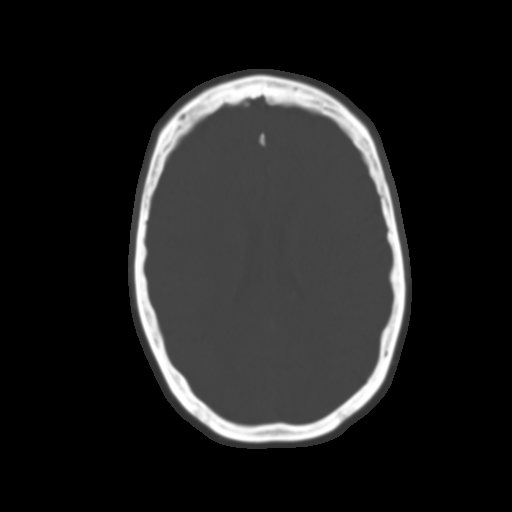
[im 22/35  brain]
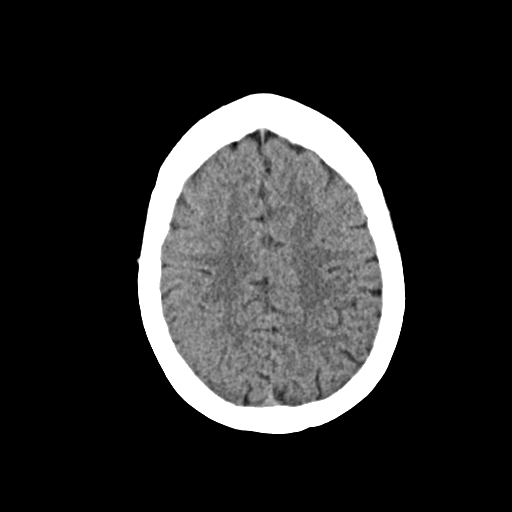
[im 25/35  brain]
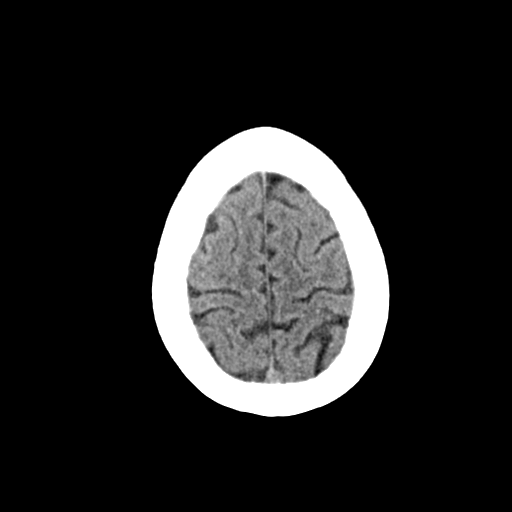
[im 29/35  brain]
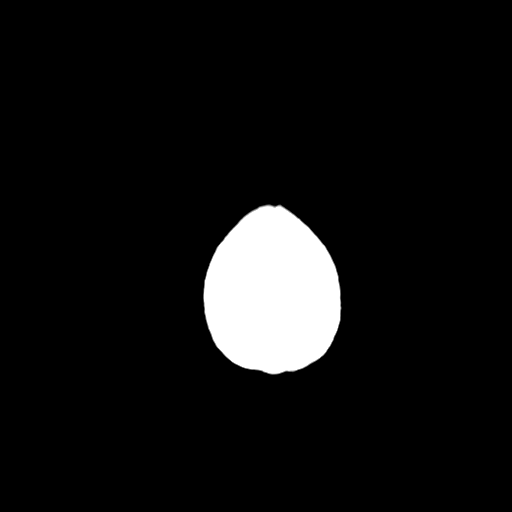
[im 32/35  brain]
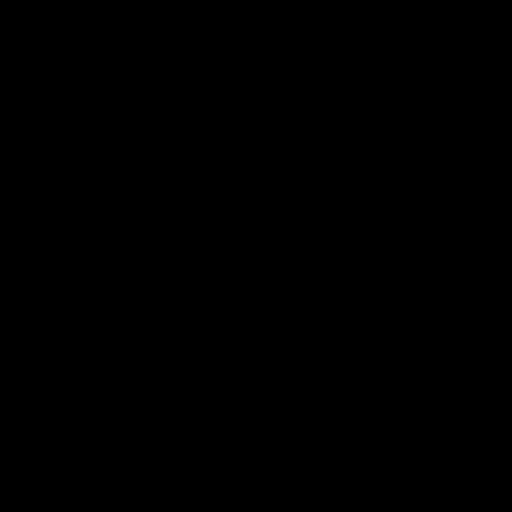
[im 32/35  bone]
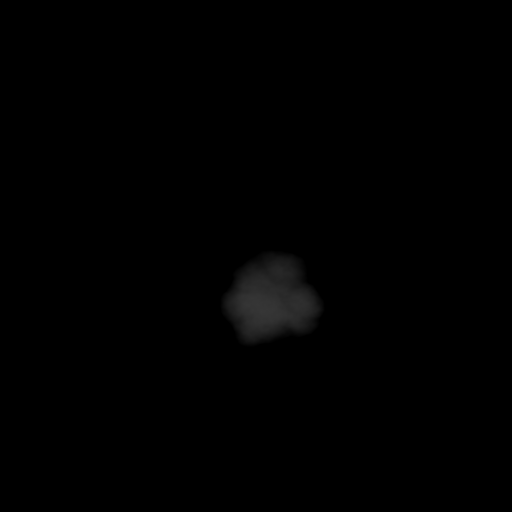

[Series 4: coronal soft · coronal · 0.34mm/px · 3 of 72 slices shown]
[im 24/72  brain]
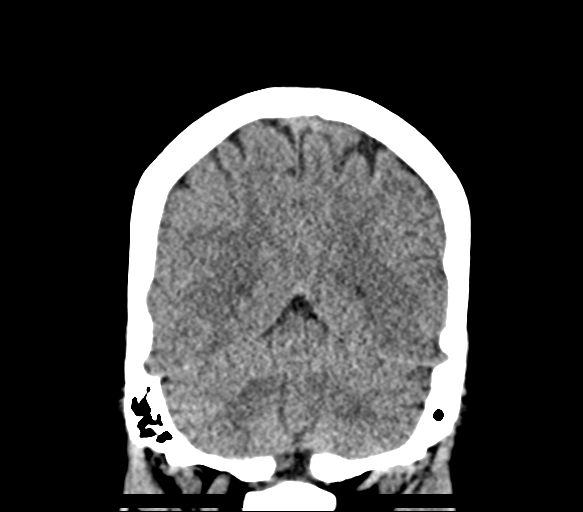
[im 32/72  brain]
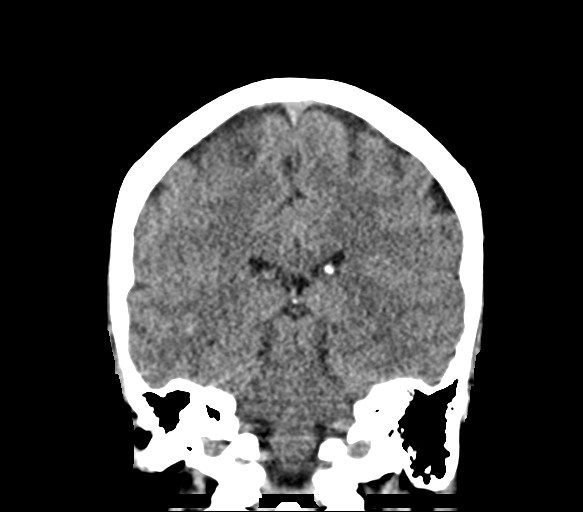
[im 40/72  brain]
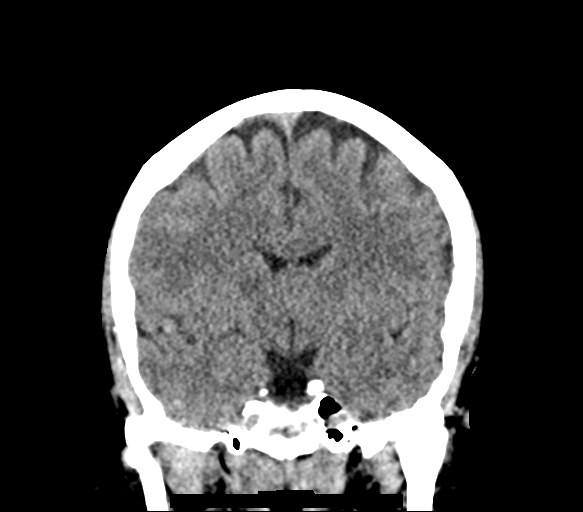

[Series 5: sag soft · sagittal · 0.34mm/px · 3 of 67 slices shown]
[im 23/67  brain]
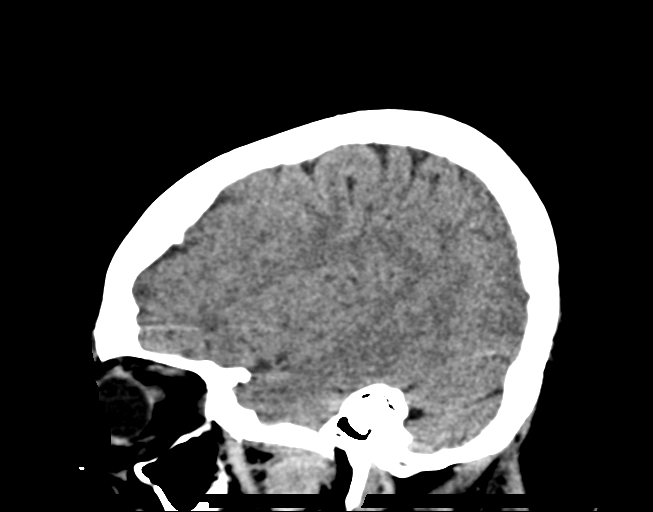
[im 34/67  brain]
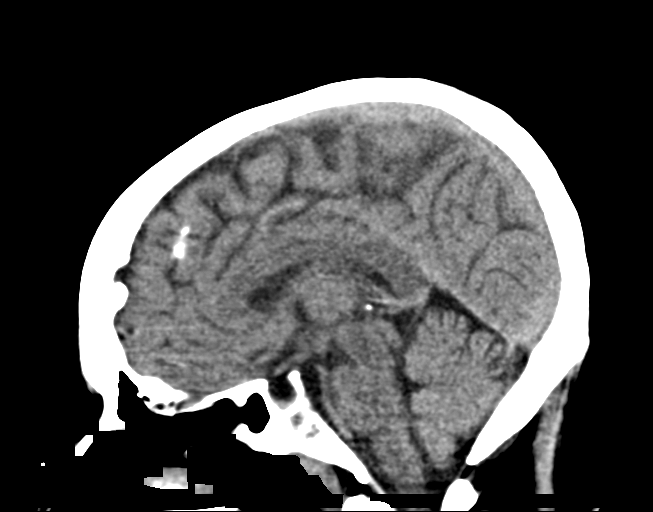
[im 45/67  brain]
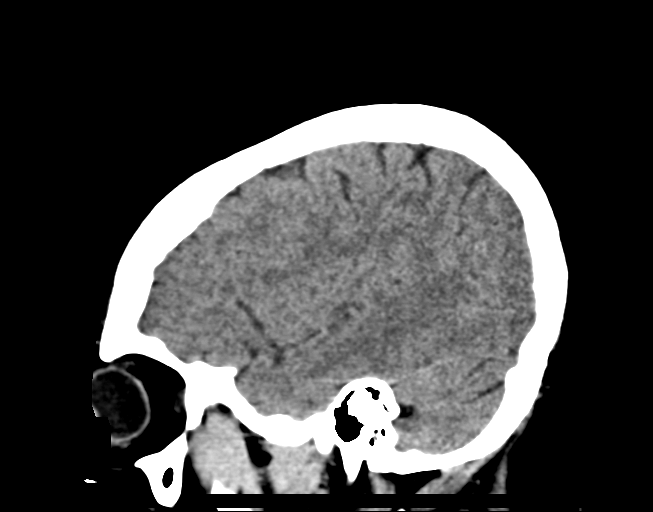

[15 of 47 positions shown; findings below may reference images not displayed]

FINDINGS: Brain: There is no mass, hemorrhage or extra-axial collection. The
size and configuration of the ventricles and extra-axial CSF spaces
are normal. The brain parenchyma is normal, without acute or chronic
infarction.

Vascular: No abnormal hyperdensity of the major intracranial
arteries or dural venous sinuses. No intracranial atherosclerosis.

Skull: The visualized skull base, calvarium and extracranial soft
tissues are normal.

Sinuses/Orbits: No fluid levels or advanced mucosal thickening of
the visualized paranasal sinuses. No mastoid or middle ear effusion.
The orbits are normal.
IMPRESSION: Normal head CT.

## 2022-12-07 ENCOUNTER — Telehealth: Payer: Self-pay

## 2022-12-07 DIAGNOSIS — E782 Mixed hyperlipidemia: Secondary | ICD-10-CM

## 2022-12-07 NOTE — Telephone Encounter (Signed)
Results reviewed with pt as per Dr. Revankar's note.  Pt verbalized understanding and had no additional questions. Routed to PCP.  

## 2022-12-07 NOTE — Telephone Encounter (Signed)
-----   Message from [place] R Revankar sent at 12/03/2022 10:52 AM EDT ----- Markedly elevated calcium score.  Bring her in for Chem-7 liver lipid check.  Diet exercise.  Call us back in 2 weeks if she has not heard about the radiology part of this report from [place].  Copy primary care Garwin Brothers, MD 12/03/2022 10:52 AM

## 2022-12-09 LAB — COMPREHENSIVE METABOLIC PANEL
ALT: 14 [IU]/L (ref 0–32)
AST: 11 [IU]/L (ref 0–40)
Albumin: 4.5 g/dL (ref 3.9–4.9)
Alkaline Phosphatase: 88 [IU]/L (ref 44–121)
BUN/Creatinine Ratio: 7 — ABNORMAL LOW (ref 9–23)
BUN: 6 mg/dL (ref 6–24)
Bilirubin Total: 0.7 mg/dL (ref 0.0–1.2)
CO2: 23 mmol/L (ref 20–29)
Calcium: 9.8 mg/dL (ref 8.7–10.2)
Chloride: 106 mmol/L (ref 96–106)
Creatinine, Ser: 0.82 mg/dL (ref 0.57–1.00)
Globulin, Total: 2.4 g/dL (ref 1.5–4.5)
Glucose: 107 mg/dL — ABNORMAL HIGH (ref 70–99)
Potassium: 4.2 mmol/L (ref 3.5–5.2)
Sodium: 144 mmol/L (ref 134–144)
Total Protein: 6.9 g/dL (ref 6.0–8.5)
eGFR: 88 mL/min/{1.73_m2} (ref >59)

## 2022-12-09 LAB — LIPID PANEL
Chol/HDL Ratio: 3.9 ratio (ref 0.0–4.4)
Cholesterol, Total: 221 mg/dL — ABNORMAL HIGH (ref 100–199)
HDL: 56 mg/dL (ref >39)
LDL Chol Calc (NIH): 110 mg/dL — ABNORMAL HIGH (ref 0–99)
Triglycerides: 322 mg/dL — ABNORMAL HIGH (ref 0–149)
VLDL Cholesterol Cal: 55 mg/dL — ABNORMAL HIGH (ref 5–40)

## 2022-12-15 ENCOUNTER — Telehealth: Payer: Self-pay

## 2022-12-15 DIAGNOSIS — E782 Mixed hyperlipidemia: Secondary | ICD-10-CM

## 2022-12-15 DIAGNOSIS — I10 Essential (primary) hypertension: Secondary | ICD-10-CM

## 2022-12-15 MED ORDER — ATORVASTATIN CALCIUM 40 MG PO TABS
40.0000 mg | ORAL_TABLET | Freq: Every day | ORAL | 3 refills | Status: DC
Start: 2022-12-15 — End: 2023-10-18

## 2022-12-15 NOTE — Telephone Encounter (Signed)
-----   Message from Aundra Dubin Revankar sent at 12/09/2022  9:39 AM EDT ----- In view of now calcium score she must be very strict with diet.  Double statin.  Liver lipid check in 6 weeks.  Exercise.  Copy primary Garwin Brothers, MD 12/09/2022 9:39 AM

## 2022-12-15 NOTE — Telephone Encounter (Signed)
Results reviewed with pt as per Dr. Kem Parkinson note.  Pt verbalized understanding and had no additional questions. Routed to PCP.  Pt reports she has not been taking cholesterol medication. Advised to start 40 mg Crestor and recheck labs in 6 weeks.

## 2022-12-25 ENCOUNTER — Telehealth: Payer: Self-pay

## 2022-12-25 NOTE — Telephone Encounter (Signed)
-----   Message from Aundra Dubin Revankar sent at 12/24/2022 11:05 AM EST ----- Mild CAD, diet and exercise. Cc pcp Garwin Brothers, MD 12/24/2022 11:05 AM

## 2022-12-25 NOTE — Telephone Encounter (Signed)
Patient notified through my chart, results forward to PCP.

## 2022-12-29 ENCOUNTER — Ambulatory Visit: Payer: MEDICAID | Attending: Cardiology | Admitting: Cardiology

## 2022-12-29 ENCOUNTER — Encounter: Payer: Self-pay | Admitting: Cardiology

## 2022-12-29 VITALS — BP 126/86 | HR 90 | Ht 66.0 in | Wt 234.1 lb

## 2022-12-29 DIAGNOSIS — I2510 Atherosclerotic heart disease of native coronary artery without angina pectoris: Secondary | ICD-10-CM

## 2022-12-29 DIAGNOSIS — I10 Essential (primary) hypertension: Secondary | ICD-10-CM | POA: Diagnosis not present

## 2022-12-29 DIAGNOSIS — E782 Mixed hyperlipidemia: Secondary | ICD-10-CM

## 2022-12-29 DIAGNOSIS — Z720 Tobacco use: Secondary | ICD-10-CM

## 2022-12-29 HISTORY — DX: Atherosclerotic heart disease of native coronary artery without angina pectoris: I25.10

## 2022-12-29 MED ORDER — CLONIDINE HCL 0.1 MG PO TABS: 0.1000 mg | ORAL_TABLET | Freq: Two times a day (BID) | ORAL | 3 refills | Status: DC

## 2022-12-29 MED ORDER — ISOSORBIDE MONONITRATE ER 120 MG PO TB24: 120.0000 mg | ORAL_TABLET | Freq: Every day | ORAL | 3 refills | Status: DC

## 2022-12-29 NOTE — Patient Instructions (Addendum)
Please keep a BP log for 2 weeks and send by MyChart or mail.                         Name and DOB__________________________ Dr. Tomie China [address] [place], [place] [address]  Blood Pressure Record Sheet To take your blood pressure, you will need a blood pressure machine. You can buy a blood pressure machine (blood pressure monitor) at your clinic, drug store, or online. When choosing one, consider: An automatic monitor that has an arm cuff. A cuff that wraps snugly around your upper arm. You should be able to fit only one finger between your arm and the cuff. A device that stores blood pressure reading results. Do not choose a monitor that measures your blood pressure from your wrist or finger. Follow your health care provider's instructions for how to take your blood pressure. To use this form: Get one reading in the morning (a.m.) 1-2 hours after you take any medicines. Get one reading in the evening (p.m.) before supper.   Blood pressure log Date: _______________________  a.m. _____________________(1st reading) HR___________            p.m. _____________________(2nd reading) HR__________  Date: _______________________  a.m. _____________________(1st reading) HR___________            p.m. _____________________(2nd reading) HR__________  Date: _______________________  a.m. _____________________(1st reading) HR___________            p.m. _____________________(2nd reading) HR__________  Date: _______________________  a.m. _____________________(1st reading) HR___________            p.m. _____________________(2nd reading) HR__________  Date: _______________________  a.m. _____________________(1st reading) HR___________            p.m. _____________________(2nd reading) HR__________  Date: _______________________  a.m. _____________________(1st reading) HR___________            p.m. _____________________(2nd reading) HR__________  Date: _______________________  a.m.  _____________________(1st reading) HR___________            p.m. _____________________(2nd reading) HR__________   This information is not intended to replace advice given to you by your health care provider. Make sure you discuss any questions you have with your health care provider. Document Revised: 05/17/2019 Document Reviewed: 05/17/2019 Elsevier Patient Education  2021 Elsevier Inc.   Medication Instructions:  Your physician has recommended you make the following change in your medication:   Start Imdur 120 mg daily.  Decrease your Clonidine to 0.1 mg twice daily. You will need a nurse visit on 01/13/23 to review your BP log.  *If you need a refill on your cardiac medications before your next appointment, please call your pharmacy*   Lab Work: None ordered If you have labs (blood work) drawn today and your tests are completely normal, you will receive your results only by: MyChart Message (if you have MyChart) OR A paper copy in the mail If you have any lab test that is abnormal or we need to change your treatment, we will call you to review the results.   Testing/Procedures: None ordered   Follow-Up: At [facility], you and your health needs are our priority.  As part of our continuing mission to provide you with exceptional heart care, we have created designated Provider Care Teams.  These Care Teams include your primary Cardiologist (physician) and Advanced Practice Providers (APPs -  Physician Assistants and Nurse Practitioners) who all work together to provide you with the care you need, when you need it.  We recommend signing up for the patient portal called "MyChart".  Sign up information is provided on this After Visit Summary.  MyChart is used to connect with patients for Virtual Visits (Telemedicine).  Patients are able to view lab/test results, encounter notes, upcoming appointments, etc.  Non-urgent messages can be sent to your provider as well.   To  learn more about what you can do with MyChart, go to [email or web address].    Your next appointment:   2 month(s)  The format for your next appointment:   In Person  Provider:   Belva Crome, MD    Other Instructions none  Important Information About Sugar

## 2022-12-29 NOTE — Progress Notes (Signed)
Cardiology Office Note:    Date:  12/29/2022   ID:  Sandy Blankenship, DOB 1975-10-12, MRN [id number]  PCP:  [facility]  Cardiologist:  Garwin Brothers, MD   Referring MD: No ref. provider found    ASSESSMENT:    1. Coronary artery disease involving native coronary artery of native heart without angina pectoris   2. Primary hypertension   3. Mixed hyperlipidemia   4. Tobacco use    PLAN:    In order of problems listed above:  Coronary artery disease: This is a new diagnosis for her.  I discussed with her at length coronary angiography report.  I told her to walk at least half an hour a day on a daily basis and she promises to do so. Essential hypertension: Blood pressure stable and diet was emphasized Stable angina: Will reduce clonidine dose to 0.1 mg twice daily and started Imdur.  She has significant chest pain issues.  She will be back in 2 weeks for a nurse visit. Mixed dyslipidemia: On lipid-lowering medications.  Diet emphasized.  She will be back for blood work as scheduled. Cigarette smoker: I spent 5 minutes with the patient discussing solely about smoking. Smoking cessation was counseled. I suggested to the patient also different medications and pharmacological interventions. Patient is keen to try stopping on its own at this time. He will get back to me if he needs any further assistance in this matter. Patient will be seen in follow-up appointment in 6 months or earlier if the patient has any concerns.    Medication Adjustments/Labs and Tests Ordered: Current medicines are reviewed at length with the patient today.  Concerns regarding medicines are outlined above.  No orders of the defined types were placed in this encounter.  Meds ordered this encounter  Medications   cloNIDine (CATAPRES) 0.1 MG tablet    Sig: Take 1 tablet (0.1 mg total) by mouth 2 (two) times daily.    Dispense:  180 tablet    Refill:  3   isosorbide mononitrate (IMDUR) 120 MG 24  hr tablet    Sig: Take 1 tablet (120 mg total) by mouth daily.    Dispense:  90 tablet    Refill:  3     No chief complaint on file.    History of Present Illness:    Sandy Blankenship is a 47 y.o. female.  Patient has past medical history of recently diagnosed nonobstructive coronary artery disease by coronary angiography, essential hypertension, mixed dyslipidemia and unfortunately continues to smoke.  She denies any problems at this time except she has chest tightness at times.  It is not related to exertion but she tells me that it is relieved by nitroglycerin.  At the time of my evaluation, the patient is alert awake oriented and in no distress.  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   Angina pectoris (HCC) 10/29/2022   Arthritis of knee, right 05/27/2015   Per patient     Arthritis of left hip 07/19/2015   Asthma, chronic 05/27/2015   Per Patient: Since childhood     Bipolar 2 disorder (HCC) 05/27/2015   Per patient, dx 2016     Cannabis use disorder, moderate, dependence (HCC) 05/11/2019   Chest pain on breathing 08/16/2018   Chondrosarcoma of bone (HCC) 02/19/2019   Chronic deep vein thrombosis (DVT) of lower extremity (HCC) 05/15/2019   Chronic knee 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 Uncontrolled. Bilateral though suspicious for migrainous   headache. Affects patient weekly. Failed Tylenol and NSAIDs. Neuro exam   unremarkable.  - Trial Imitrex 50 mg PRN and Topomax 25 mg daily with intent to increase   to 50 mg daily after 1 week  - RTC 1 month for reassessment of control     Chronic pain syndrome 09/24/2021   Cigarette smoker 10/29/2022   Cocaine use disorder (HCC) 07/21/2019   COPD (chronic obstructive pulmonary disease) (HCC)     Elevated troponin I level 04/21/2020   Enchondroma of right femur 01/06/2019   Added automatically from request for surgery [id number]     Gastroenteritis 05/26/2016   GERD (gastroesophageal reflux disease) 02/23/2017   Glaucoma suspect of both eyes 07/19/2015   Per [facility] Note: 07/12/15     Hip pain 06/19/2015   History of alcohol abuse 05/27/2015   Started on naltrexone approximately 02/2015     History of anemia 05/27/2015   Per patient: 2012     History of stomach ulcers 05/27/2015   Per patient     Hypertension    Hypokalemia 05/15/2019   Intractable chronic migraine without aura and without status migrainosus 02/23/2017   Irritable bowel syndrome with constipation 08/16/2018   Lactic acidosis 04/21/2020   Low back pain 07/22/2021   Mass of joint of right knee 01/06/2019   Added automatically from request for surgery [id number]     MDD (major depressive disorder), recurrent severe, 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   Metabolic acidosis 04/21/2020   Mixed hyperlipidemia 02/23/2017   No blood products 05/27/2015   Religious purposes: no blood products desired     Obesity (BMI 35.0-39.9 without comorbidity) 10/29/2022   Pain of upper abdomen 08/16/2018   Posterior vitreous detachment of both eyes 07/19/2015   Per [facility] note 07/12/15     Primary hypertension 02/23/2017   S/P tubal ligation 05/27/2015   S/p tubal 1999     Severe obesity (BMI 35.0-35.9 with comorbidity) (HCC) 10/07/2022   Sleep disorder breathing 10/14/2022   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      Past Surgical History:  Procedure Laterality Date   HIP SURGERY     KNEE SURGERY      Current Medications: Current Meds  Medication Sig   albuterol (PROVENTIL  HFA;VENTOLIN HFA) 108 (90 Base) MCG/ACT inhaler Inhale 2 puffs into the lungs every 6 (six) hours as needed for wheezing or shortness of breath.   amitriptyline (ELAVIL) 25 MG tablet Take 25 mg by mouth at bedtime.   aspirin EC 81 MG tablet Take 1 tablet (81 mg total) by mouth daily. Swallow whole.   atorvastatin (LIPITOR) 40 MG tablet Take 1 tablet (40 mg total) by mouth daily.   chlorpheniramine-HYDROcodone (TUSSIONEX) 10-8 MG/5ML Take 5 mLs by mouth 2 (two) times daily as needed for cough.   dicyclomine (BENTYL) 20 MG tablet Take 20 mg by mouth 4 (four) times daily as needed for spasms.   diphenhydrAMINE (BENADRYL) 25 mg capsule Take 1 capsule (25 mg total) by mouth every 6 (six) hours as needed  for itching.   EMGALITY 120 MG/ML SOSY Inject 1 mL into the skin every 30 (thirty) days.   hydrochlorothiazide (HYDRODIURIL) 50 MG tablet Take 0.5 tablets (25 mg total) by mouth daily.   hydrOXYzine (VISTARIL) 50 MG capsule Take 1 capsule (50 mg total) by mouth 3 (three) times daily as needed. As needed for anxiety.   isosorbide mononitrate (IMDUR) 120 MG 24 hr tablet Take 1 tablet (120 mg total) by mouth daily.   linaclotide (LINZESS) 290 MCG CAPS capsule Take 1 capsule (290 mcg total) by mouth daily before breakfast.   metoprolol tartrate (LOPRESSOR) 25 MG tablet Take 25 mg by mouth 2 (two) times daily.   naltrexone (DEPADE) 50 MG tablet Take 1 tablet (50 mg total) by mouth daily.   naproxen (NAPROSYN) 500 MG tablet Take 1 tablet (500 mg total) by mouth 2 (two) times daily with a meal. For 5 days. Then take 1 tablet 2 times a day AS NEEDED after that.   nitroGLYCERIN (NITROSTAT) 0.4 MG SL tablet Place 1 tablet (0.4 mg total) under the tongue every 5 (five) minutes as needed.   omeprazole (PRILOSEC) 20 MG capsule Take 1 capsule (20 mg total) by mouth daily.   ondansetron (ZOFRAN) 4 MG tablet Take 1 tablet (4 mg total) by mouth every 6 (six) hours.   predniSONE (DELTASONE) 10 MG tablet Take 5 tablets (50  mg total) by mouth daily.   sertraline (ZOLOFT) 50 MG tablet Take 1 tablet (50 mg total) by mouth daily.   SUMAtriptan (IMITREX) 50 MG tablet Take 1 tablet (50 mg total) by mouth every 2 (two) hours as needed for migraine. May repeat in 2 hours if headache persists or recurs.   SYMBICORT 160-4.5 MCG/ACT inhaler Inhale 2 puffs into the lungs 2 (two) times daily.   tiZANidine (ZANAFLEX) 4 MG tablet Take 4 mg by mouth at bedtime.   topiramate (TOPAMAX) 25 MG tablet Take 1 tablet (25 mg) in the evening, then increase to 2 tablets (50 mg) in the evening after 1 week.   [DISCONTINUED] cloNIDine (CATAPRES) 0.2 MG tablet Take 1 tablet (0.2 mg total) by mouth 2 (two) times daily.     Allergies:   Penicillins, Tramadol, Tramadol, Aspirin, and Penicillins   Social History   Socioeconomic History   Marital status: Single    Spouse name: Not on file   Number of children: Not on file   Years of education: Not on file   Highest education level: Not on file  Occupational History   Not on file  Tobacco Use   Smoking status: Former    Average packs/day: 1 pack/day for 21.0 years (21.0 ttl pk-yrs)    Types: Cigarettes    Start date: 2024    Quit date: 80    Years since quitting: 32.9   Smokeless tobacco: Never  Vaping Use   Vaping status: Never Used  Substance and Sexual Activity   Alcohol use: Yes    Comment: occ   Drug use: Never   Sexual activity: Not on file  Other Topics Concern   Not on file  Social History Narrative   ** Merged History Encounter **       Social Determinants of Health   Financial Resource Strain: Medium Risk (12/23/2020)   Received from [facility], [facility]   Overall Financial Resource Strain (CARDIA)    Difficulty of Paying Living Expenses: Somewhat hard  Food Insecurity: Low Risk  (04/28/2022)   Received from [facility]   Hunger  Vital Sign    Worried About Programme researcher, broadcasting/film/video in the Last Year: Never true    Ran Out of Food in the Last Year: Never  true  Transportation Needs: Not on file (04/28/2022)  Physical Activity: Insufficiently Active (12/23/2020)   Received from [facility], [facility]   Exercise Vital Sign    Days of Exercise per Week: 1 day    Minutes of Exercise per Session: 10 min  Stress: No Stress Concern Present (07/30/2021)   Received from [facility], [facility] - Occupational Stress Questionnaire    Feeling of Stress : Only a Trevor  Social Connections: Unknown (06/12/2021)   Received from [facility], [facility]   Social Network    Social Network: Not on file     Family History: The patient's family history includes Heart disease in her brother. There is no history of Hypertension, Cancer, or Diabetes.  ROS:   Please see the history of present illness.    All other systems reviewed and are negative.  EKGs/Labs/Other Studies Reviewed:    The following studies were reviewed today: I discussed coronary angiography findings with the patient at length   Recent Labs: 09/14/2022: Hemoglobin 13.6; Platelets 271 12/08/2022: ALT 14; BUN 6; Creatinine, Ser 0.82; Potassium 4.2; Sodium 144  Recent Lipid Panel    Component Value Date/Time   CHOL 221 (H) 12/08/2022 1601   TRIG 322 (H) 12/08/2022 1601   HDL 56 12/08/2022 1601   CHOLHDL 3.9 12/08/2022 1601   CHOLHDL 5.1 (H) 05/27/2015 1533   VLDL 56 (H) 05/27/2015 1533   LDLCALC 110 (H) 12/08/2022 1601    Physical Exam:    VS:  BP 126/86   Pulse 90   Ht 5\' 6"  (1.676 m)   Wt 234 lb 1.3 oz (106.2 kg)   LMP 04/25/2016 (Exact Date)   SpO2 92%   BMI 37.78 kg/m     Wt Readings from Last 3 Encounters:  12/29/22 234 lb 1.3 oz (106.2 kg)  10/29/22 237 lb 1.9 oz (107.6 kg)  09/14/22 243 lb (110.2 kg)     GEN: Patient is in no acute distress HEENT: Normal NECK: No JVD; No carotid bruits LYMPHATICS: No lymphadenopathy CARDIAC: Hear sounds regular, 2/6 systolic murmur at the apex. RESPIRATORY:  Clear to  auscultation without rales, wheezing or rhonchi  ABDOMEN: Soft, non-tender, non-distended MUSCULOSKELETAL:  No edema; No deformity  SKIN: Warm and dry NEUROLOGIC:  Alert and oriented x 3 PSYCHIATRIC:  Normal affect   Signed, Garwin Brothers, MD  12/29/2022 1:10 PM    [facility]

## 2023-01-09 ENCOUNTER — Emergency Department (HOSPITAL_BASED_OUTPATIENT_CLINIC_OR_DEPARTMENT_OTHER): Payer: MEDICAID

## 2023-01-09 ENCOUNTER — Other Ambulatory Visit: Payer: Self-pay

## 2023-01-09 ENCOUNTER — Encounter (HOSPITAL_BASED_OUTPATIENT_CLINIC_OR_DEPARTMENT_OTHER): Payer: Self-pay

## 2023-01-09 ENCOUNTER — Emergency Department (HOSPITAL_BASED_OUTPATIENT_CLINIC_OR_DEPARTMENT_OTHER)
Admission: EM | Admit: 2023-01-09 | Discharge: 2023-01-09 | Disposition: A | Discharge status: Home / Self Care | Payer: MEDICAID | Attending: Emergency Medicine | Admitting: Emergency Medicine

## 2023-01-09 DIAGNOSIS — K625 Hemorrhage of anus and rectum: Secondary | ICD-10-CM

## 2023-01-09 DIAGNOSIS — Z7982 Long term (current) use of aspirin: Secondary | ICD-10-CM | POA: Insufficient documentation

## 2023-01-09 DIAGNOSIS — R197 Diarrhea, unspecified: Secondary | ICD-10-CM

## 2023-01-09 LAB — I-STAT CHEM 8, ED
BUN: 5 mg/dL — ABNORMAL LOW (ref 6–20)
Calcium, Ion: 1.24 mmol/L (ref 1.15–1.40)
Chloride: 103 mmol/L (ref 98–111)
Creatinine, Ser: 0.9 mg/dL (ref 0.44–1.00)
Glucose, Bld: 76 mg/dL (ref 70–99)
HCT: 41 % (ref 36.0–46.0)
Hemoglobin: 13.9 g/dL (ref 12.0–15.0)
Potassium: 3.2 mmol/L — ABNORMAL LOW (ref 3.5–5.1)
Sodium: 140 mmol/L (ref 135–145)
TCO2: 25 mmol/L (ref 22–32)

## 2023-01-09 LAB — CBC
HCT: 41.8 % (ref 36.0–46.0)
Hemoglobin: 13.5 g/dL (ref 12.0–15.0)
MCH: 32.8 pg (ref 26.0–34.0)
MCHC: 32.3 g/dL (ref 30.0–36.0)
MCV: 101.7 fL — ABNORMAL HIGH (ref 80.0–100.0)
Platelets: 311 10*3/uL (ref 150–400)
RBC: 4.11 MIL/uL (ref 3.87–5.11)
RDW: 12.7 % (ref 11.5–15.5)
WBC: 5.3 10*3/uL (ref 4.0–10.5)
nRBC: 0 % (ref 0.0–0.2)

## 2023-01-09 LAB — URINALYSIS, ROUTINE W REFLEX MICROSCOPIC
Bilirubin Urine: NEGATIVE
Glucose, UA: NEGATIVE mg/dL
Hgb urine dipstick: NEGATIVE
Ketones, ur: NEGATIVE mg/dL
Leukocytes,Ua: NEGATIVE
Nitrite: NEGATIVE
Protein, ur: NEGATIVE mg/dL
Specific Gravity, Urine: 1.015 (ref 1.005–1.030)
pH: 5.5 (ref 5.0–8.0)

## 2023-01-09 LAB — OCCULT BLOOD X 1 CARD TO LAB, STOOL: Fecal Occult Bld: POSITIVE — AB

## 2023-01-09 LAB — MAGNESIUM: Magnesium: 2 mg/dL (ref 1.7–2.4)

## 2023-01-09 LAB — LIPASE, BLOOD: Lipase: 25 U/L (ref 11–51)

## 2023-01-09 MED ORDER — HYOSCYAMINE SULFATE 0.125 MG SL SUBL: 0.1250 mg | SUBLINGUAL_TABLET | SUBLINGUAL | 0 refills | Status: DC | PRN

## 2023-01-09 MED ORDER — DIPHENOXYLATE-ATROPINE 2.5-0.025 MG PO TABS
1.0000 | ORAL_TABLET | Freq: Four times a day (QID) | ORAL | 0 refills | Status: DC | PRN
Start: 2023-01-09 — End: 2023-12-03

## 2023-01-09 NOTE — ED Triage Notes (Signed)
The patient noticed rectal bleeding yesterday that is bright red. She started having chest pain today. She has a hx of rectal bleeding.

## 2023-01-09 NOTE — ED Provider Notes (Signed)
[facility] Provider Note   CSN: [id number] Arrival date & time: 01/09/23  1132     History  Chief Complaint  Patient presents with   Diarrhea   Chest Pain   Rectal Bleeding    Sandy Blankenship is a 47 y.o. female with a past medical history of a choledochal cyst,, history of alcohol misuse disorder who presents emergency department with chest cold symptoms and diarrhea for the past 2 days.  Patient has a history of recent upper endoscopy on 12/24/2022 that showed grade 1 choledochal cyst.  She had no obvious signs of esophageal varices or peptic ulcer.  Patient reports that she had multiple watery stools and then began passing bright red blood per rectum.  She had a couple of clots on her toilet paper.  No large-volume bright red stools only.  This is what brought her to the ER.  She denies fevers chills weakness shortness of breath lightheadedness or abdominal pain.   Diarrhea Chest Pain Rectal Bleeding      Home Medications Prior to Admission medications   Medication Sig Start Date End Date Taking? Authorizing Provider  diphenoxylate-atropine (LOMOTIL) 2.5-0.025 MG tablet Take 1 tablet by mouth 4 (four) times daily as needed for diarrhea or loose stools. 01/09/23  Yes Shaliah Wann, PA-C  hyoscyamine (LEVSIN/SL) 0.125 MG SL tablet Place 1 tablet (0.125 mg total) under the tongue every 4 (four) hours as needed for cramping (pain). Up to 1.25 mg daily 01/09/23  Yes Aricela Bertagnolli, PA-C  albuterol (PROVENTIL HFA;VENTOLIN HFA) 108 (90 Base) MCG/ACT inhaler Inhale 2 puffs into the lungs every 6 (six) hours as needed for wheezing or shortness of breath. 05/27/15   McKeag, Janine Ores, MD  amitriptyline (ELAVIL) 25 MG tablet Take 25 mg by mouth at bedtime. 08/06/22   [provider]  aspirin EC 81 MG tablet Take 1 tablet (81 mg total) by mouth daily. Swallow whole. 10/29/22   Revankar, Aundra Dubin, MD  atorvastatin (LIPITOR) 40 MG tablet Take  1 tablet (40 mg total) by mouth daily. 12/15/22   Revankar, Aundra Dubin, MD  chlorpheniramine-HYDROcodone (TUSSIONEX) 10-8 MG/5ML Take 5 mLs by mouth 2 (two) times daily as needed for cough. 11/12/21   Prosperi, Christian H, PA-C  cloNIDine (CATAPRES) 0.1 MG tablet Take 1 tablet (0.1 mg total) by mouth 2 (two) times daily. 12/29/22   Revankar, Aundra Dubin, MD  diphenhydrAMINE (BENADRYL) 25 mg capsule Take 1 capsule (25 mg total) by mouth every 6 (six) hours as needed for itching. 10/27/21   Nanavati, Ankit, MD  EMGALITY 120 MG/ML SOSY Inject 1 mL into the skin every 30 (thirty) days. 08/06/22   [provider]  hydrochlorothiazide (HYDRODIURIL) 50 MG tablet Take 0.5 tablets (25 mg total) by mouth daily. 05/05/18   Durward Parcel, DO  hydrOXYzine (VISTARIL) 50 MG capsule Take 1 capsule (50 mg total) by mouth 3 (three) times daily as needed. As needed for anxiety. 05/27/15   McKeag, Janine Ores, MD  isosorbide mononitrate (IMDUR) 120 MG 24 hr tablet Take 1 tablet (120 mg total) by mouth daily. 12/29/22   Revankar, Aundra Dubin, MD  linaclotide Karlene Einstein) 290 MCG CAPS capsule Take 1 capsule (290 mcg total) by mouth daily before breakfast. 07/24/16   McKeag, Janine Ores, MD  metoprolol tartrate (LOPRESSOR) 25 MG tablet Take 25 mg by mouth 2 (two) times daily. 08/03/22   [provider]  naltrexone (DEPADE) 50 MG tablet Take 1 tablet (50 mg total) by  mouth daily. 05/27/15   McKeag, Janine Ores, MD  naproxen (NAPROSYN) 500 MG tablet Take 1 tablet (500 mg total) by mouth 2 (two) times daily with a meal. For 5 days. Then take 1 tablet 2 times a day AS NEEDED after that. 05/26/16   McKeag, Janine Ores, MD  nitroGLYCERIN (NITROSTAT) 0.4 MG SL tablet Place 1 tablet (0.4 mg total) under the tongue every 5 (five) minutes as needed. 10/29/22 01/27/23  Revankar, Aundra Dubin, MD  omeprazole (PRILOSEC) 20 MG capsule Take 1 capsule (20 mg total) by mouth daily. 09/14/22   Glyn Ade, MD  ondansetron (ZOFRAN) 4 MG tablet Take 1 tablet (4 mg total)  by mouth every 6 (six) hours. 11/12/21   Prosperi, Christian H, PA-C  potassium chloride SA (KLOR-CON M) 20 MEQ tablet Take 2 tablets (40 mEq total) by mouth 2 (two) times daily for 5 days. 09/15/22 09/20/22  Maxwell Marion, PA-C  predniSONE (DELTASONE) 10 MG tablet Take 5 tablets (50 mg total) by mouth daily. 10/27/21   Derwood Kaplan, MD  sertraline (ZOLOFT) 50 MG tablet Take 1 tablet (50 mg total) by mouth daily. 06/09/16   McKeag, Janine Ores, MD  SUMAtriptan (IMITREX) 50 MG tablet Take 1 tablet (50 mg total) by mouth every 2 (two) hours as needed for migraine. May repeat in 2 hours if headache persists or recurs. 02/23/17   Durward Parcel, DO  SYMBICORT 160-4.5 MCG/ACT inhaler Inhale 2 puffs into the lungs 2 (two) times daily. 09/07/22   [provider]  tiZANidine (ZANAFLEX) 4 MG tablet Take 4 mg by mouth at bedtime. 10/02/22   [provider]  topiramate (TOPAMAX) 25 MG tablet Take 1 tablet (25 mg) in the evening, then increase to 2 tablets (50 mg) in the evening after 1 week. 02/23/17   Durward Parcel, DO      Allergies    Penicillins, Tramadol, Tramadol, Aspirin, and Penicillins    Review of Systems   Review of Systems  Cardiovascular:  Positive for chest pain.  Gastrointestinal:  Positive for diarrhea and hematochezia.    Physical Exam Updated Vital Signs BP (!) 141/94   Pulse 75   Temp 98 F (36.7 C) (Oral)   Resp 16   Ht 5\' 6"  (1.676 m)   Wt 106.1 kg   LMP 04/25/2016 (Exact Date)   SpO2 92%   BMI 37.77 kg/m  Physical Exam Vitals and nursing note reviewed.  Constitutional:      General: She is not in acute distress.    Appearance: She is well-developed. She is not diaphoretic.  HENT:     Head: Normocephalic and atraumatic.     Right Ear: External ear normal.     Left Ear: External ear normal.     Nose: Nose normal.     Mouth/Throat:     Mouth: Mucous membranes are moist.  Eyes:     General: No scleral icterus.    Conjunctiva/sclera: Conjunctivae normal.   Cardiovascular:     Rate and Rhythm: Normal rate and regular rhythm.     Heart sounds: Normal heart sounds. No murmur heard.    No friction rub. No gallop.  Pulmonary:     Effort: Pulmonary effort is normal. No respiratory distress.     Breath sounds: Normal breath sounds.  Abdominal:     General: Bowel sounds are normal. There is no distension.     Palpations: Abdomen is soft. There is no mass.     Tenderness: There is no abdominal tenderness.  There is no guarding.  Genitourinary:    Comments: Digital Rectal Exam reveals sphincter with good tone. No external hemorrhoids. No masses or fissures. Stool color is brown  and pink  Musculoskeletal:     Cervical back: Normal range of motion.  Skin:    General: Skin is warm and dry.  Neurological:     Mental Status: She is alert and oriented to person, place, and time.  Psychiatric:        Behavior: Behavior normal.     ED Results / Procedures / Treatments   Labs (all labs ordered are listed, but only abnormal results are displayed) Labs Reviewed  CBC - Abnormal; Notable for the following components:      Result Value   MCV 101.7 (*)    All other components within normal limits  OCCULT BLOOD X 1 CARD TO LAB, STOOL - Abnormal; Notable for the following components:   Fecal Occult Bld POSITIVE (*)    All other components within normal limits  I-STAT CHEM 8, ED - Abnormal; Notable for the following components:   Potassium 3.2 (*)    BUN 5 (*)    All other components within normal limits  LIPASE, BLOOD  URINALYSIS, ROUTINE W REFLEX MICROSCOPIC  MAGNESIUM    EKG None  Radiology DG ABD ACUTE 2+V W 1V CHEST  Result Date: 01/09/2023 CLINICAL DATA:  Cough and diarrhea EXAM: DG ABDOMEN ACUTE WITH 1 VIEW CHEST COMPARISON:  CXR 09/14/22. FINDINGS: Mild bilateral basilar atelectasis. No consolidation, edema, effusion. Normal cardiopericardial contours. Degenerative changes along the spine. Gas seen in nondilated loops of small and large  bowel. Scattered stool. No obstruction. No definite free air seen beneath the diaphragm on the upright view. Degenerative changes of the spine. Left hip arthroplasty with screw fixated acetabular cup and Press-Fit femoral component. The tip of the femoral component is not completely included in the imaging field. Presumed vascular calcifications along the pelvis. IMPRESSION: Bilateral basilar atelectasis. Nonspecific bowel gas pattern with scattered stool. Electronically Signed   By: Karen Kays M.D.   On: 01/09/2023 14:27    Procedures Procedures    Medications Ordered in ED Medications - No data to display  ED Course/ Medical Decision Making/ A&P                                 Medical Decision Making This patient presents to the ED for concern of brpr, this involves an extensive number of treatment options, and is a complaint that carries with it a high risk of complications and morbidity.  The differential diagnosis for lower GI bleed includes but is not limited to high flow upper GI bleed, diverticulosis, vascular ectasia/AVM, inflammatory bowel disease, infectious colitis, mesenteric ischemia or ischemic colitis,  colorectal cancer or polyps, internal hemorrhoids, aortoenteric fistula, rectal foreign body, rectal ulceration or anal fissure.   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), Chronic deep vein thrombosis (DVT) of lower extremity (HCC) (05/15/2019), Chronic knee pain after total replacement of right knee joint (09/24/2021), Chronic migraine without aura  without status migrainosus, not intractable (02/23/2017), Chronic pain  syndrome (09/24/2021), Cigarette smoker (10/29/2022), Cocaine use disorder (HCC) (07/21/2019), COPD (chronic obstructive pulmonary disease) (HCC), Elevated troponin I level (04/21/2020), Enchondroma of right femur (01/06/2019), Gastroenteritis (05/26/2016), GERD (gastroesophageal reflux disease) (02/23/2017), Glaucoma suspect of both eyes (07/19/2015), Hip pain (06/19/2015), History of alcohol abuse (05/27/2015), History of anemia (05/27/2015), History of stomach ulcers (05/27/2015), Hypertension, Hypokalemia (05/15/2019), Intractable chronic migraine without aura and without status migrainosus (02/23/2017), Irritable bowel syndrome with constipation (08/16/2018), Lactic acidosis (04/21/2020), Low back pain (07/22/2021), Mass of joint of right knee (01/06/2019), MDD (major depressive disorder), recurrent severe, 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), Metabolic acidosis (04/21/2020), Mixed hyperlipidemia (02/23/2017), No blood products (05/27/2015), Obesity (BMI 35.0-39.9 without comorbidity) (10/29/2022), Pain of upper abdomen (08/16/2018), Posterior vitreous detachment of both eyes (07/19/2015), Primary hypertension (02/23/2017), S/P tubal ligation (05/27/2015), Severe obesity (BMI 35.0-35.9 with comorbidity) (HCC) (10/07/2022), Sleep disorder breathing (10/14/2022), 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   Social Determinants of Health:       SDOH Screenings Food Insecurity: Low Risk  (04/28/2022)     Received from [facility] Utilities: Low Risk  (04/28/2022)     Received from [facility] Financial Resource Strain: Medium Risk (12/23/2020)     Received from [facility], [facility] Physical Activity: Insufficiently Active (12/23/2020)     Received from [facility],  [facility] Social Connections: Unknown (06/12/2021)     Received from [facility], [facility] Stress: No Stress Concern Present (07/30/2021)     Received from [facility], [facility] Tobacco Use: Medium Risk (01/09/2023)   Additional history:  {Additional history obtained from emr {External records from outside source obtained and reviewed including outside GI records/ procedures  Lab Tests:  I Ordered, and personally interpreted labs.  The pertinent results include:   Occult stools positive.  Chem-8 shows mild hypokalemia of insignificant value, CBC shows macrocytosis without anemia.  Lipase within normal limits, normal urine and magnesium level.  Imaging Studies:  I ordered imaging studies including acute abdominal film I independently visualized and interpreted imaging which showed no evidence of pneumonia or free air in the abdomen I agree with the radiologist interpretation  Cardiac Monitoring/ECG:       The patient was maintained on a cardiac monitor.  I personally viewed and interpreted the cardiac monitored which showed an underlying rhythm of: Sinus rhythm, EKG shows borderline prolonged QT  Medicines ordered and prescription drug management:    Test Considered:       Consider CT angiogram to rule out ischemic colitis however patient has a soft benign abdominal exam  Critical Interventions:         Consultations Obtained:   Problem List / ED Course:       Diarrhea, unspecified type  (primary encounter diagnosis)  Rectal bleeding    MDM: Patient here with diarrhea and bright red blood per rectum.  She does not have any signs of pneumonia.  She has had diarrhea and I suspect she likely has irritation of internal hemorrhoids.  Patient will be discharged with antidiarrheals.  She may use hemorrhoid suppositories available over-the-counter.  Follow-up in the outpatient setting with gastroenterology.  Otherwise appropriate for discharge at this  time   Dispostion:  After consideration of the diagnostic results and the patients response to treatment, I feel that the patent would benefit from discharge with strict return precautions.    Amount and/or Complexity of Data Reviewed Labs: ordered.  Final Clinical Impression(s) / ED Diagnoses Final diagnoses:  Diarrhea, unspecified type  Rectal bleeding    Rx / DC Orders ED Discharge Orders          Ordered    diphenoxylate-atropine (LOMOTIL) 2.5-0.025 MG tablet  4 times daily PRN        01/09/23 1448    hyoscyamine (LEVSIN/SL) 0.125 MG SL tablet  Every 4 hours PRN        01/09/23 1448              Arthor Captain, PA-C 01/09/23 1450    Gwyneth Sprout, MD 01/10/23 [phone]

## 2023-01-09 NOTE — Discharge Instructions (Addendum)
Contact a health care provider if: You have pain or tenderness in your abdomen. You have a fever. You feel weak or nauseous. You cannot poop. You have new or more rectal bleeding. You have black or dark red poop. You vomit, and the vomit has blood or something that looks like coffee grounds in it. Get help right away if: You faint. You have severe pain in your rectum. These symptoms may be an emergency. Get help right away. Call 911.

## 2023-02-17 ENCOUNTER — Other Ambulatory Visit: Payer: Self-pay

## 2023-02-18 ENCOUNTER — Ambulatory Visit: Payer: MEDICAID | Attending: Cardiology | Admitting: Cardiology

## 2023-02-18 ENCOUNTER — Encounter: Payer: Self-pay | Admitting: Cardiology

## 2023-02-18 VITALS — BP 124/76 | HR 102 | Ht 66.0 in | Wt 236.1 lb

## 2023-02-18 DIAGNOSIS — E782 Mixed hyperlipidemia: Secondary | ICD-10-CM

## 2023-02-18 DIAGNOSIS — I2510 Atherosclerotic heart disease of native coronary artery without angina pectoris: Secondary | ICD-10-CM | POA: Diagnosis not present

## 2023-02-18 DIAGNOSIS — I471 Supraventricular tachycardia, unspecified: Secondary | ICD-10-CM

## 2023-02-18 DIAGNOSIS — F17210 Nicotine dependence, cigarettes, uncomplicated: Secondary | ICD-10-CM

## 2023-02-18 DIAGNOSIS — I10 Essential (primary) hypertension: Secondary | ICD-10-CM | POA: Diagnosis not present

## 2023-02-18 DIAGNOSIS — E669 Obesity, unspecified: Secondary | ICD-10-CM

## 2023-02-18 NOTE — Patient Instructions (Signed)

## 2023-02-18 NOTE — Progress Notes (Signed)
 Cardiology Office Note:    Date:  02/18/2023   ID:  Sandy Blankenship, DOB 03/23/1975, MRN [id number]  PCP:  [facility]  Cardiologist:  Jennifer JONELLE Crape, MD   Referring MD: [facility], [facility]    ASSESSMENT:    1. Coronary artery disease involving native coronary artery of native heart, unspecified whether angina present   2. SVT (supraventricular tachycardia) (HCC)   3. Primary hypertension   4. Obesity (BMI 35.0-39.9 without comorbidity)   5. Mixed hyperlipidemia   6. Cigarette smoker    PLAN:    In order of problems listed above:  Coronary artery disease: Secondary prevention stressed with the patient.  Importance of compliance with diet medication stressed and she vocalized understanding.  She was advised to ambulate to the best of her ability. Cigarette smoking: I spent 5 minutes with the patient discussing solely about smoking. Smoking cessation was counseled. I suggested to the patient also different medications and pharmacological interventions. Patient is keen to try stopping on its own at this time. He will get back to me if he needs any further assistance in this matter. Mixed dyslipidemia: On lipid-lowering medications followed by primary care. SVT: Stable.  No symptoms now.  It appears that the pneumonia may have triggered her issues.  He is on beta-blockers and tolerating well. Morbid obesity: Weight reduction stressed.  Diet emphasized.  Risks of obesity explained.  She promises to do better. Patient will be seen in follow-up appointment in 6 months or earlier if the patient has any concerns.    Medication Adjustments/Labs and Tests Ordered: Current medicines are reviewed at length with the patient today.  Concerns regarding medicines are outlined above.  No orders of the defined types were placed in this encounter.  No orders of the defined types were placed in this encounter.    No chief complaint on file.    History of Present Illness:     Sandy Blankenship is a 48 y.o. female.  Patient has past medical history of coronary artery disease, essential hypertension, mixed dyslipidemia, SVT.  She has history of smoking and alcohol use.  She tells me that she stopped smoking 3 weeks ago.  She went to the emergency room was treated for pneumonitis and SVT.  Subsequently she has done fine.  It appears that the pulmonary infection probably triggered her arrhythmia.  At the time of my evaluation, the patient is alert awake oriented and in no distress.  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 Uncontrolled. Bilateral though suspicious for migrainous   headache. Affects patient weekly. Failed Tylenol  and NSAIDs. Neuro exam   unremarkable.  - Trial Imitrex  50  mg PRN and Topomax 25 mg daily with intent to increase   to 50 mg daily after 1 week  - RTC 1 month for reassessment of control     Chronic pain syndrome 09/24/2021   Cigarette smoker 10/29/2022   Cocaine use disorder (HCC) 07/21/2019   COPD (chronic obstructive pulmonary disease) (HCC)    Elevated troponin I level 04/21/2020   Enchondroma of right femur 01/06/2019   Added automatically from  request for surgery [id number]     Gastroenteritis 05/26/2016   GERD (gastroesophageal reflux disease) 02/23/2017   Glaucoma suspect of both eyes 07/19/2015   Per [facility] Note: 07/12/15     Hip pain 06/19/2015   History of alcohol abuse 05/27/2015   Started on naltrexone  approximately 02/2015     History of anemia 05/27/2015   Per patient: 2012     History of stomach ulcers 05/27/2015   Per patient     Hypertension    Hypokalemia 05/15/2019   Intractable chronic migraine without aura and without status migrainosus 02/23/2017   Irritable bowel syndrome with constipation 08/16/2018   Lactic acidosis 04/21/2020   Low back pain 07/22/2021   Mass of joint of right knee 01/06/2019   Added automatically from request for surgery [id number]     MDD (major depressive disorder), recurrent severe, 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   Metabolic acidosis 04/21/2020   Mixed hyperlipidemia 02/23/2017   No blood products 05/27/2015   Religious purposes: no blood products desired     Obesity (BMI 35.0-39.9 without comorbidity) 10/29/2022   Pain of upper abdomen 08/16/2018   Posterior vitreous detachment of both eyes 07/19/2015   Per [facility] note 07/12/15     Primary hypertension 02/23/2017   S/P tubal ligation 05/27/2015   S/p tubal 1999     Severe obesity (BMI 35.0-35.9 with comorbidity) (HCC) 10/07/2022   Sleep disorder breathing 10/14/2022   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      Past Surgical History:  Procedure Laterality Date   HIP SURGERY     KNEE SURGERY      Current Medications: Current Meds  Medication Sig   albuterol  (PROVENTIL  HFA;VENTOLIN  HFA) 108 (90 Base) MCG/ACT inhaler Inhale 2 puffs into the lungs every 6 (six) hours as needed for  wheezing or shortness of breath.   amitriptyline (ELAVIL) 25 MG tablet Take 25 mg by mouth at bedtime.   aspirin  EC 81 MG tablet Take 1 tablet (81 mg total) by mouth daily. Swallow whole.   atorvastatin  (LIPITOR) 40 MG tablet Take 1 tablet (40 mg total) by mouth daily.   chlorpheniramine-HYDROcodone (TUSSIONEX) 10-8 MG/5ML Take 5 mLs by mouth 2 (two) times daily as needed for cough.   cloNIDine  (CATAPRES ) 0.1 MG tablet Take 1 tablet (0.1 mg total) by mouth 2 (two) times daily.   diphenhydrAMINE  (BENADRYL ) 25 mg capsule Take 1 capsule (25 mg total) by mouth every 6 (six) hours as needed for itching.   diphenoxylate -atropine  (LOMOTIL ) 2.5-0.025 MG tablet Take 1 tablet by mouth 4 (four) times daily as needed for diarrhea or loose stools.   EMGALITY 120 MG/ML SOSY Inject 1 mL into the skin every 30 (thirty) days.   hydrochlorothiazide  (HYDRODIURIL ) 50 MG tablet Take 0.5 tablets (25 mg total) by mouth  daily.   hydrOXYzine  (VISTARIL ) 50 MG capsule Take 1 capsule (50 mg total) by mouth 3 (three) times daily as needed. As needed for anxiety.   hyoscyamine  (LEVSIN /SL) 0.125 MG SL tablet Place 1 tablet (0.125 mg total) under the tongue every 4 (four) hours as needed for cramping (pain). Up to 1.25 mg daily   isosorbide  mononitrate (IMDUR ) 120 MG 24 hr tablet Take 1 tablet (120 mg total) by mouth daily.   linaclotide  (LINZESS ) 290 MCG CAPS capsule Take 1 capsule (290 mcg total) by mouth daily before breakfast.   metoprolol tartrate (LOPRESSOR) 25 MG tablet Take 25 mg by mouth 2 (two) times daily.   naltrexone  (DEPADE) 50 MG tablet Take 1 tablet (50 mg total) by mouth daily.   nitroGLYCERIN  (NITROSTAT ) 0.4 MG SL tablet Place 0.4 mg under the tongue every 5 (five) minutes as needed for chest pain.   omeprazole  (PRILOSEC) 20 MG capsule Take 1 capsule (20 mg total) by mouth daily.   potassium chloride  SA (KLOR-CON  M) 20 MEQ tablet Take 40 mEq by mouth 2 (two) times daily.   SUMAtriptan  (IMITREX ) 50 MG tablet  Take 1 tablet (50 mg total) by mouth every 2 (two) hours as needed for migraine. May repeat in 2 hours if headache persists or recurs.   SYMBICORT 160-4.5 MCG/ACT inhaler Inhale 2 puffs into the lungs 2 (two) times daily.   tiZANidine (ZANAFLEX) 4 MG tablet Take 4 mg by mouth at bedtime.   topiramate  (TOPAMAX ) 25 MG tablet Take 1 tablet (25 mg) in the evening, then increase to 2 tablets (50 mg) in the evening after 1 week.     Allergies:   Penicillins, Tramadol, Tramadol, Aspirin , and Penicillins   Social History   Socioeconomic History   Marital status: Single    Spouse name: Not on file   Number of children: Not on file   Years of education: Not on file   Highest education level: Not on file  Occupational History   Not on file  Tobacco Use   Smoking status: Former    Average packs/day: 1 pack/day for 21.0 years (21.0 ttl pk-yrs)    Types: Cigarettes    Start date: 2024    Quit date: 52    Years since quitting: 33.0   Smokeless tobacco: Never  Vaping Use   Vaping status: Never Used  Substance and Sexual Activity   Alcohol use: Yes    Comment: occ   Drug use: Never   Sexual activity: Not on file  Other Topics Concern   Not on file  Social History Narrative   ** Merged History Encounter **       Social Drivers of Health   Financial Resource Strain: Medium Risk (12/23/2020)   Received from [facility], [facility]   Overall Financial Resource Strain (CARDIA)    Difficulty of Paying Living Expenses: Somewhat hard  Food Insecurity: Low Risk  (01/19/2023)   Received from [facility]   Hunger Vital Sign    Worried About Running Out of Food in the Last Year: Never true    Ran Out of Food in the Last Year: Never true  Transportation Needs: No Transportation Needs (01/19/2023)   Received from [employer]    In the past 12 months, has lack of reliable transportation kept you from medical appointments, meetings, work or from getting things needed  for daily living? : No  Physical Activity: Insufficiently Active (12/23/2020)   Received from [facility], [facility]  Exercise Vital Sign    Days of Exercise per Week: 1 day    Minutes of Exercise per Session: 10 min  Stress: No Stress Concern Present (07/30/2021)   Received from [facility], [facility] - Occupational Stress Questionnaire    Feeling of Stress : Only a [place]  Social Connections: Unknown (06/12/2021)   Received from [facility], [facility]   Social Network    Social Network: Not on file     Family History: The patient's family history includes Heart disease in her brother. There is no history of Hypertension, Cancer, or Diabetes.  ROS:   Please see the history of present illness.    All other systems reviewed and are negative.  EKGs/Labs/Other Studies Reviewed:    The following studies were reviewed today: I discussed my findings with the patient at length   Recent Labs: 12/08/2022: ALT 14 01/09/2023: BUN 5; Creatinine, Ser 0.90; Hemoglobin 13.9; Magnesium 2.0; Platelets 311; Potassium 3.2; Sodium 140  Recent Lipid Panel    Component Value Date/Time   CHOL 221 (H) 12/08/2022 1601   TRIG 322 (H) 12/08/2022 1601   HDL 56 12/08/2022 1601   CHOLHDL 3.9 12/08/2022 1601   CHOLHDL 5.1 (H) 05/27/2015 1533   VLDL 56 (H) 05/27/2015 1533   LDLCALC 110 (H) 12/08/2022 1601    Physical Exam:    VS:  BP 124/76   Pulse (!) 102   Ht 5' 6 (1.676 m)   Wt 236 lb 1.3 oz (107.1 kg)   LMP 04/25/2016 (Exact Date)   SpO2 99%   BMI 38.10 kg/m     Wt Readings from Last 3 Encounters:  02/18/23 236 lb 1.3 oz (107.1 kg)  01/09/23 234 lb (106.1 kg)  12/29/22 234 lb 1.3 oz (106.2 kg)     GEN: Patient is in no acute distress HEENT: Normal NECK: No JVD; No carotid bruits LYMPHATICS: No lymphadenopathy CARDIAC: Hear sounds regular, 2/6 systolic murmur at the apex. RESPIRATORY:  Clear to auscultation without  rales, wheezing or rhonchi  ABDOMEN: Soft, non-tender, non-distended MUSCULOSKELETAL:  No edema; No deformity  SKIN: Warm and dry NEUROLOGIC:  Alert and oriented x 3 PSYCHIATRIC:  Normal affect   Signed, Jennifer JONELLE Crape, MD  02/18/2023 3:37 PM    [facility]

## 2023-04-06 DIAGNOSIS — Z96651 Presence of right artificial knee joint: Secondary | ICD-10-CM | POA: Insufficient documentation

## 2023-05-11 DIAGNOSIS — Q444 Choledochal cyst: Secondary | ICD-10-CM | POA: Insufficient documentation

## 2023-10-18 ENCOUNTER — Other Ambulatory Visit: Payer: Self-pay | Admitting: Cardiology

## 2023-10-18 DIAGNOSIS — E782 Mixed hyperlipidemia: Secondary | ICD-10-CM

## 2023-12-03 ENCOUNTER — Encounter: Payer: Self-pay | Admitting: Cardiology

## 2023-12-03 ENCOUNTER — Ambulatory Visit: Payer: MEDICAID | Admitting: Cardiology

## 2023-12-03 ENCOUNTER — Ambulatory Visit: Payer: MEDICAID | Attending: Cardiology | Admitting: Cardiology

## 2023-12-03 ENCOUNTER — Other Ambulatory Visit (HOSPITAL_BASED_OUTPATIENT_CLINIC_OR_DEPARTMENT_OTHER): Payer: Self-pay

## 2023-12-03 VITALS — BP 132/80 | HR 78 | Resp 18 | Ht 66.0 in | Wt 230.1 lb

## 2023-12-03 DIAGNOSIS — I2510 Atherosclerotic heart disease of native coronary artery without angina pectoris: Secondary | ICD-10-CM | POA: Diagnosis present

## 2023-12-03 DIAGNOSIS — E782 Mixed hyperlipidemia: Secondary | ICD-10-CM | POA: Diagnosis present

## 2023-12-03 DIAGNOSIS — E669 Obesity, unspecified: Secondary | ICD-10-CM | POA: Insufficient documentation

## 2023-12-03 DIAGNOSIS — Z720 Tobacco use: Secondary | ICD-10-CM | POA: Diagnosis present

## 2023-12-03 DIAGNOSIS — I10 Essential (primary) hypertension: Secondary | ICD-10-CM | POA: Diagnosis present

## 2023-12-03 MED ORDER — NICOTINE 7 MG/24HR TD PT24
7.0000 mg | MEDICATED_PATCH | Freq: Every day | TRANSDERMAL | 0 refills | Status: AC
  Filled 2023-12-03: qty 28, 28d supply, fill #0

## 2023-12-03 NOTE — Progress Notes (Signed)
 Cardiology Office Note:    Date:  12/03/2023   ID:  Sandy Blankenship, DOB 1975/11/30, MRN [id number]  PCP:  [facility]  Cardiologist:  Jennifer JONELLE Crape, MD   Referring MD: [facility], [facility]    ASSESSMENT:    1. Coronary artery disease involving native coronary artery of native heart without angina pectoris   2. Primary hypertension   3. Obesity (BMI 35.0-39.9 without comorbidity)   4. Mixed hyperlipidemia   5. Tobacco use    PLAN:    In order of problems listed above:  Coronary artery disease: Secondary prevention stressed with the patient.  Importance of compliance with diet medication stressed and patient verbalized standing.  She was placed to ambulate to the best of her ability.  She was told to walk at least half an hour a day on a daily basis. Cigarette smoker: I spent 5 minutes with the patient discussing solely about smoking. Smoking cessation was counseled. I suggested to the patient also different medications and pharmacological interventions. Patient is keen to try stopping on its own at this time. He will get back to me if he needs any further assistance in this matter.  She requests nicotine patches at the end of the visit and I would like to send them in for her. Essential hypertension: Blood pressure stable and diet was emphasized.  Lifestyle modification urged. Mixed dyslipidemia and obesity: On lipid-lowering medications and she will be back in the next few days for lipid work.  Diet was emphasized.  Weight reduction stressed risks of obesity explained and she promises to do better. Patient will be seen in follow-up appointment in 6 months or earlier if the patient has any concerns.    Medication Adjustments/Labs and Tests Ordered: Current medicines are reviewed at length with the patient today.  Concerns regarding medicines are outlined above.  Orders Placed This Encounter  Procedures   Comprehensive metabolic panel with GFR   Lipid panel    Meds ordered this encounter  Medications   nicotine (NICODERM CQ - DOSED IN MG/24 HR) 7 mg/24hr patch    Sig: Place 1 patch (7 mg total) onto the skin daily.    Dispense:  28 patch    Refill:  0     Chief Complaint  Patient presents with   Follow-up     History of Present Illness:    Sandy Blankenship is a 48 y.o. female.  Patient has past medical history of coronary artery disease, essential hypertension, mixed dyslipidemia and unfortunately continues to smoke.  She leads a sedentary lifestyle.  She denies any chest pain orthopnea or PND.  At the time of my evaluation, the patient is alert awake oriented and in no distress.  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 Uncontrolled. Bilateral though suspicious for migrainous   headache. Affects patient weekly. Failed Tylenol  and NSAIDs. Neuro exam   unremarkable.  - Trial Imitrex  50 mg PRN and Topomax 25 mg daily with intent to increase   to 50 mg daily after 1 week  - RTC 1 month for reassessment of control     Chronic pain syndrome 09/24/2021   Cigarette smoker 10/29/2022   Cocaine use disorder (HCC) 07/21/2019    COPD (chronic obstructive pulmonary disease) (HCC)    Elevated troponin I level 04/21/2020   Enchondroma of right femur 01/06/2019   Added automatically from request for surgery [id number]     Gastroenteritis 05/26/2016   GERD (gastroesophageal reflux disease) 02/23/2017   Glaucoma suspect of both eyes 07/19/2015   Per [facility] Note: 07/12/15     Hip pain 06/19/2015   History of alcohol abuse 05/27/2015   Started on naltrexone  approximately 02/2015     History of anemia 05/27/2015   Per patient: 2012     History of stomach ulcers 05/27/2015   Per patient     History of total right knee replacement 04/06/2023   Hypertension    Hypokalemia 05/15/2019   Intractable chronic migraine without aura and without status migrainosus 02/23/2017   Irritable bowel syndrome with constipation 08/16/2018   Lactic acidosis 04/21/2020   Low back pain 07/22/2021   Mass of joint of right knee 01/06/2019   Added automatically from request for surgery [id number]     MDD (major depressive disorder), recurrent severe, 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   Metabolic acidosis 04/21/2020   Mixed hyperlipidemia 02/23/2017   No blood products 05/27/2015   Religious purposes: no blood products desired     Obesity (BMI 35.0-39.9 without comorbidity) 10/29/2022   Pain of upper abdomen 08/16/2018   Posterior vitreous detachment of both eyes 07/19/2015   Per [facility] note 07/12/15     Primary hypertension 02/23/2017   S/P tubal ligation 05/27/2015   S/p tubal 1999     Severe obesity (BMI 35.0-35.9 with comorbidity) (HCC) 10/07/2022   Sleep disorder breathing 10/14/2022   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      Past Surgical History:  Procedure Laterality Date   HIP SURGERY     KNEE  SURGERY      Current Medications: Current Meds  Medication Sig   albuterol  (PROVENTIL  HFA;VENTOLIN  HFA) 108 (90 Base) MCG/ACT inhaler Inhale 2 puffs into the lungs every 6 (six) hours as needed for wheezing or shortness of breath.   amitriptyline (ELAVIL) 25 MG tablet Take 25 mg by mouth at bedtime.   aspirin  EC 81 MG tablet Take 1 tablet (81 mg total) by mouth daily. Swallow whole.   atorvastatin  (LIPITOR) 40 MG tablet Take 1 tablet (40 mg total) by mouth daily.   desvenlafaxine (PRISTIQ) 50 MG 24 hr tablet Take 50 mg by mouth daily.   dicyclomine (BENTYL) 20 MG tablet Take 20 mg by mouth 4 (four) times daily as needed.   esomeprazole (NEXIUM) 40 MG capsule Take 40 mg by mouth daily.   magnesium oxide (MAG-OX) 400 (240 Mg) MG tablet Take 1  tablet by mouth daily.   medroxyPROGESTERone (PROVERA) 10 MG tablet Take 10 mg by mouth daily.   nicotine (NICODERM CQ - DOSED IN MG/24 HR) 7 mg/24hr patch Place 1 patch (7 mg total) onto the skin daily.   Oxycodone HCl 10 MG TABS Take 1 tablet by mouth.   oxyCODONE-acetaminophen  (PERCOCET) 7.5-325 MG tablet Take 1 tablet by mouth 4 (four) times daily.   potassium chloride  (KLOR-CON ) 8 MEQ tablet Take 8 mEq by mouth daily.   sertraline  (ZOLOFT ) 50 MG tablet Take 1 tablet (50 mg total) by mouth daily.   VRAYLAR 1.5 MG capsule Take 1.5 mg by mouth daily.     Allergies:   Penicillins, Tramadol, Tramadol, Aspirin , and Penicillins   Social History   Socioeconomic History   Marital status: Single    Spouse name: Not on file   Number of children: Not on file   Years of education: Not on file   Highest education level: Not on file  Occupational History   Not on file  Tobacco Use   Smoking status: Former    Average packs/day: 1 pack/day for 21.0 years (21.0 ttl pk-yrs)    Types: Cigarettes    Start date: 2024    Quit date: 59    Years since quitting: 33.8   Smokeless tobacco: Never  Vaping Use   Vaping status: Never Used  Substance and Sexual  Activity   Alcohol use: Yes    Comment: occ   Drug use: Never   Sexual activity: Not on file  Other Topics Concern   Not on file  Social History Narrative   ** Merged History Encounter **       Social Drivers of Health   Financial Resource Strain: Medium Risk (12/23/2020)   Received from [facility]   Overall Financial Resource Strain (CARDIA)    Difficulty of Paying Living Expenses: Somewhat hard  Food Insecurity: Low Risk  (06/15/2023)   Received from [facility]   Hunger Vital Sign    Within the past 12 months, you worried that your food would run out before you got money to buy more: Never true    Within the past 12 months, the food you bought just didn't last and you didn't have money to get more. : Never true  Transportation Needs: No Transportation Needs (06/15/2023)   Received from [employer]    In the past 12 months, has lack of reliable transportation kept you from medical appointments, meetings, work or from getting things needed for daily living? : No  Physical Activity: Insufficiently Active (12/23/2020)   Received from [facility]   Exercise Vital Sign    On average, how many days per week do you engage in moderate to strenuous exercise (like a brisk walk)?: 1 day    On average, how many minutes do you engage in exercise at this level?: 10 min  Stress: No Stress Concern Present (07/30/2021)   Received from [facility] - Occupational Stress Questionnaire    Feeling of Stress : Only a [place]  Social Connections: Unknown (06/12/2021)   Received from [facility]   Social Network    Social Network: Not on file     Family History: The patient's family history includes Heart disease in her brother. There is no history of Hypertension, Cancer, or Diabetes.  ROS:   Please see the history of present illness.    All other systems reviewed and are  negative.  EKGs/Labs/Other Studies Reviewed:    The  following studies were reviewed today: I discussed my findings with the patient at length       Recent Labs: 12/08/2022: ALT 14 01/09/2023: BUN 5; Creatinine, Ser 0.90; Hemoglobin 13.9; Magnesium 2.0; Platelets 311; Potassium 3.2; Sodium 140  Recent Lipid Panel    Component Value Date/Time   CHOL 221 (H) 12/08/2022 1601   TRIG 322 (H) 12/08/2022 1601   HDL 56 12/08/2022 1601   CHOLHDL 3.9 12/08/2022 1601   CHOLHDL 5.1 (H) 05/27/2015 1533   VLDL 56 (H) 05/27/2015 1533   LDLCALC 110 (H) 12/08/2022 1601    Physical Exam:    VS:  BP 132/80 (BP Location: Left Arm, Patient Position: Sitting, Cuff Size: Normal)   Pulse 78   Resp 18   Ht 5' 6 (1.676 m)   Wt 230 lb 1.3 oz (104.4 kg)   LMP 04/25/2016 (Exact Date)   BMI 37.14 kg/m     Wt Readings from Last 3 Encounters:  12/03/23 230 lb 1.3 oz (104.4 kg)  02/18/23 236 lb 1.3 oz (107.1 kg)  01/09/23 234 lb (106.1 kg)     GEN: Patient is in no acute distress HEENT: Normal NECK: No JVD; No carotid bruits LYMPHATICS: No lymphadenopathy CARDIAC: Hear sounds regular, 2/6 systolic murmur at the apex. RESPIRATORY:  Clear to auscultation without rales, wheezing or rhonchi  ABDOMEN: Soft, non-tender, non-distended MUSCULOSKELETAL:  No edema; No deformity  SKIN: Warm and dry NEUROLOGIC:  Alert and oriented x 3 PSYCHIATRIC:  Normal affect   Signed, Jennifer JONELLE Crape, MD  12/03/2023 4:38 PM    [facility]

## 2023-12-03 NOTE — Patient Instructions (Addendum)
 Medication Instructions:  Your physician has recommended you make the following change in your medication:   Use Nicotine patches as directed  *If you need a refill on your cardiac medications before your next appointment, please call your pharmacy*   Lab Work: Your physician recommends that you return for lab work in: the next few days for CMP and lipids. You need to have labs done when you are fasting. [facility] lab is located on the 3rd floor, [address]. Hours are Monday - Friday 8 am to 4 pm, closed 11:30 am to 1:00 pm. You do NOT need an appointment.    If you have labs (blood work) drawn today and your tests are completely normal, you will receive your results only by: MyChart Message (if you have MyChart) OR A paper copy in the mail If you have any lab test that is abnormal or we need to change your treatment, we will call you to review the results.   Testing/Procedures: None ordered   Follow-Up: At [facility], you and your health needs are our priority.  As part of our continuing mission to provide you with exceptional heart care, we have created designated Provider Care Teams.  These Care Teams include your primary Cardiologist (physician) and Advanced Practice Providers (APPs -  Physician Assistants and Nurse Practitioners) who all work together to provide you with the care you need, when you need it.  We recommend signing up for the patient portal called MyChart.  Sign up information is provided on this After Visit Summary.  MyChart is used to connect with patients for Virtual Visits (Telemedicine).  Patients are able to view lab/test results, encounter notes, upcoming appointments, etc.  Non-urgent messages can be sent to your provider as well.   To learn more about what you can do with MyChart, go to [email or web address].    Your next appointment:   12 month(s)  The format for your next appointment:   In Person  Provider:   Jennifer Crape, MD     Other Instructions none  Important Information About Sugar

## 2023-12-06 ENCOUNTER — Other Ambulatory Visit (HOSPITAL_BASED_OUTPATIENT_CLINIC_OR_DEPARTMENT_OTHER): Payer: Self-pay

## 2023-12-13 ENCOUNTER — Other Ambulatory Visit (HOSPITAL_BASED_OUTPATIENT_CLINIC_OR_DEPARTMENT_OTHER): Payer: Self-pay

## 2023-12-15 ENCOUNTER — Ambulatory Visit: Payer: Self-pay | Admitting: Cardiology

## 2023-12-15 LAB — LIPID PANEL
Chol/HDL Ratio: 2.8 ratio (ref 0.0–4.4)
Cholesterol, Total: 161 mg/dL (ref 100–199)
HDL: 58 mg/dL (ref >39)
LDL Chol Calc (NIH): 81 mg/dL (ref 0–99)
Triglycerides: 126 mg/dL (ref 0–149)
VLDL Cholesterol Cal: 22 mg/dL (ref 5–40)

## 2023-12-15 LAB — COMPREHENSIVE METABOLIC PANEL WITH GFR
ALT: 12 IU/L (ref 0–32)
AST: 10 IU/L (ref 0–40)
Albumin: 4.3 g/dL (ref 3.9–4.9)
Alkaline Phosphatase: 78 IU/L (ref 41–116)
BUN/Creatinine Ratio: 13 (ref 9–23)
BUN: 10 mg/dL (ref 6–24)
Bilirubin Total: 0.7 mg/dL (ref 0.0–1.2)
CO2: 21 mmol/L (ref 20–29)
Calcium: 9.8 mg/dL (ref 8.7–10.2)
Chloride: 104 mmol/L (ref 96–106)
Creatinine, Ser: 0.75 mg/dL (ref 0.57–1.00)
Globulin, Total: 2.2 g/dL (ref 1.5–4.5)
Glucose: 80 mg/dL (ref 70–99)
Potassium: 4.8 mmol/L (ref 3.5–5.2)
Sodium: 143 mmol/L (ref 134–144)
Total Protein: 6.5 g/dL (ref 6.0–8.5)
eGFR: 98 mL/min/1.73 (ref >59)

## 2023-12-21 ENCOUNTER — Other Ambulatory Visit: Payer: Self-pay | Admitting: Cardiology

## 2023-12-21 DIAGNOSIS — E782 Mixed hyperlipidemia: Secondary | ICD-10-CM
